# Patient Record
Sex: Female | Born: 1955 | Race: White | Hispanic: No | Marital: Married | Smoking: Current every day smoker
Health system: Southern US, Community
[De-identification: ages and names within clinical notes are randomized; demographics above are authoritative.]

## PROBLEM LIST (undated history)

## (undated) DIAGNOSIS — R1011 Right upper quadrant pain: Secondary | ICD-10-CM

## (undated) DIAGNOSIS — K120 Recurrent oral aphthae: Secondary | ICD-10-CM

## (undated) DIAGNOSIS — K649 Unspecified hemorrhoids: Secondary | ICD-10-CM

## (undated) DIAGNOSIS — B1920 Unspecified viral hepatitis C without hepatic coma: Secondary | ICD-10-CM

## (undated) DIAGNOSIS — Z972 Presence of dental prosthetic device (complete) (partial): Secondary | ICD-10-CM

## (undated) DIAGNOSIS — M8580 Other specified disorders of bone density and structure, unspecified site: Secondary | ICD-10-CM

## (undated) DIAGNOSIS — L570 Actinic keratosis: Secondary | ICD-10-CM

## (undated) DIAGNOSIS — M549 Dorsalgia, unspecified: Secondary | ICD-10-CM

## (undated) DIAGNOSIS — E039 Hypothyroidism, unspecified: Secondary | ICD-10-CM

## (undated) DIAGNOSIS — M1990 Unspecified osteoarthritis, unspecified site: Secondary | ICD-10-CM

## (undated) DIAGNOSIS — L900 Lichen sclerosus et atrophicus: Secondary | ICD-10-CM

## (undated) DIAGNOSIS — H353190 Nonexudative age-related macular degeneration, unspecified eye, stage unspecified: Secondary | ICD-10-CM

## (undated) DIAGNOSIS — R5383 Other fatigue: Secondary | ICD-10-CM

## (undated) DIAGNOSIS — E079 Disorder of thyroid, unspecified: Secondary | ICD-10-CM

## (undated) DIAGNOSIS — E785 Hyperlipidemia, unspecified: Secondary | ICD-10-CM

## (undated) DIAGNOSIS — N760 Acute vaginitis: Secondary | ICD-10-CM

## (undated) DIAGNOSIS — K5730 Diverticulosis of large intestine without perforation or abscess without bleeding: Secondary | ICD-10-CM

## (undated) DIAGNOSIS — M4800 Spinal stenosis, site unspecified: Secondary | ICD-10-CM

## (undated) HISTORY — DX: Nonexudative age-related macular degeneration, unspecified eye, stage unspecified: H35.3190

## (undated) HISTORY — DX: Hypothyroidism, unspecified: E03.9

## (undated) HISTORY — DX: Unspecified hemorrhoids: K64.9

## (undated) HISTORY — DX: Right upper quadrant pain: R10.11

## (undated) HISTORY — DX: Unspecified viral hepatitis C without hepatic coma: B19.20

## (undated) HISTORY — DX: Acute vaginitis: N76.0

## (undated) HISTORY — DX: Diverticulosis of large intestine without perforation or abscess without bleeding: K57.30

## (undated) HISTORY — DX: Disorder of thyroid, unspecified: E07.9

## (undated) HISTORY — DX: Recurrent oral aphthae: K12.0

## (undated) HISTORY — PX: ABDOMINAL HYSTERECTOMY: SHX81

## (undated) HISTORY — DX: Other specified disorders of bone density and structure, unspecified site: M85.80

## (undated) HISTORY — DX: Actinic keratosis: L57.0

## (undated) HISTORY — DX: Hyperlipidemia, unspecified: E78.5

## (undated) HISTORY — DX: Dorsalgia, unspecified: M54.9

## (undated) HISTORY — DX: Other fatigue: R53.83

## (undated) HISTORY — DX: Lichen sclerosus et atrophicus: L90.0

## (undated) HISTORY — PX: TONSILLECTOMY: SUR1361

## (undated) HISTORY — DX: Unspecified osteoarthritis, unspecified site: M19.90

---

## 2004-04-23 ENCOUNTER — Ambulatory Visit: Payer: Self-pay | Admitting: Family Medicine

## 2004-05-05 ENCOUNTER — Encounter: Payer: Self-pay | Admitting: Internal Medicine

## 2004-05-05 ENCOUNTER — Ambulatory Visit: Payer: Self-pay | Admitting: Family Medicine

## 2004-11-04 ENCOUNTER — Ambulatory Visit: Payer: Self-pay | Admitting: Family Medicine

## 2007-04-25 ENCOUNTER — Ambulatory Visit: Payer: Self-pay | Admitting: Internal Medicine

## 2007-04-25 DIAGNOSIS — G8929 Other chronic pain: Secondary | ICD-10-CM | POA: Insufficient documentation

## 2007-04-25 DIAGNOSIS — E039 Hypothyroidism, unspecified: Secondary | ICD-10-CM | POA: Insufficient documentation

## 2007-04-25 DIAGNOSIS — M810 Age-related osteoporosis without current pathological fracture: Secondary | ICD-10-CM | POA: Insufficient documentation

## 2007-04-25 DIAGNOSIS — L570 Actinic keratosis: Secondary | ICD-10-CM | POA: Insufficient documentation

## 2007-04-25 DIAGNOSIS — R079 Chest pain, unspecified: Secondary | ICD-10-CM | POA: Insufficient documentation

## 2007-04-25 DIAGNOSIS — M549 Dorsalgia, unspecified: Secondary | ICD-10-CM

## 2007-04-27 ENCOUNTER — Encounter: Payer: Self-pay | Admitting: Internal Medicine

## 2007-05-15 ENCOUNTER — Encounter: Payer: Self-pay | Admitting: Internal Medicine

## 2007-05-22 ENCOUNTER — Encounter: Payer: Self-pay | Admitting: Internal Medicine

## 2007-05-26 ENCOUNTER — Encounter (INDEPENDENT_AMBULATORY_CARE_PROVIDER_SITE_OTHER): Payer: Self-pay | Admitting: *Deleted

## 2007-06-08 ENCOUNTER — Ambulatory Visit: Payer: Self-pay | Admitting: Internal Medicine

## 2007-06-08 DIAGNOSIS — M1990 Unspecified osteoarthritis, unspecified site: Secondary | ICD-10-CM | POA: Insufficient documentation

## 2007-06-08 DIAGNOSIS — N760 Acute vaginitis: Secondary | ICD-10-CM | POA: Insufficient documentation

## 2007-07-17 ENCOUNTER — Telehealth (INDEPENDENT_AMBULATORY_CARE_PROVIDER_SITE_OTHER): Payer: Self-pay | Admitting: *Deleted

## 2007-09-27 ENCOUNTER — Telehealth: Payer: Self-pay | Admitting: Internal Medicine

## 2007-12-07 ENCOUNTER — Ambulatory Visit: Payer: Self-pay | Admitting: Internal Medicine

## 2007-12-08 ENCOUNTER — Telehealth (INDEPENDENT_AMBULATORY_CARE_PROVIDER_SITE_OTHER): Payer: Self-pay | Admitting: *Deleted

## 2008-02-02 ENCOUNTER — Telehealth: Payer: Self-pay | Admitting: Internal Medicine

## 2008-02-12 ENCOUNTER — Encounter: Payer: Self-pay | Admitting: Internal Medicine

## 2008-02-13 ENCOUNTER — Ambulatory Visit: Payer: Self-pay | Admitting: Internal Medicine

## 2008-02-23 ENCOUNTER — Telehealth: Payer: Self-pay | Admitting: Internal Medicine

## 2008-02-27 ENCOUNTER — Encounter: Payer: Self-pay | Admitting: Internal Medicine

## 2008-02-27 ENCOUNTER — Ambulatory Visit: Payer: Self-pay | Admitting: Internal Medicine

## 2008-02-28 ENCOUNTER — Encounter: Payer: Self-pay | Admitting: Internal Medicine

## 2008-05-24 ENCOUNTER — Telehealth: Payer: Self-pay | Admitting: Internal Medicine

## 2008-05-30 ENCOUNTER — Ambulatory Visit: Payer: Self-pay | Admitting: Internal Medicine

## 2008-05-30 DIAGNOSIS — R5381 Other malaise: Secondary | ICD-10-CM | POA: Insufficient documentation

## 2008-05-30 DIAGNOSIS — R5383 Other fatigue: Secondary | ICD-10-CM

## 2008-07-03 ENCOUNTER — Encounter: Payer: Self-pay | Admitting: Internal Medicine

## 2008-07-22 ENCOUNTER — Telehealth: Payer: Self-pay | Admitting: Internal Medicine

## 2008-09-23 ENCOUNTER — Telehealth: Payer: Self-pay | Admitting: Internal Medicine

## 2008-11-22 ENCOUNTER — Telehealth: Payer: Self-pay | Admitting: Internal Medicine

## 2009-01-22 ENCOUNTER — Telehealth: Payer: Self-pay | Admitting: Internal Medicine

## 2009-02-07 ENCOUNTER — Ambulatory Visit: Payer: Self-pay | Admitting: Internal Medicine

## 2009-02-07 DIAGNOSIS — R1011 Right upper quadrant pain: Secondary | ICD-10-CM | POA: Insufficient documentation

## 2009-02-07 LAB — CONVERTED CEMR LAB
Ketones, urine, test strip: NEGATIVE
Nitrite: NEGATIVE
Protein, U semiquant: NEGATIVE
RBC / HPF: 0
Specific Gravity, Urine: 1.01
WBC Urine, dipstick: NEGATIVE
pH: 6.5

## 2009-02-10 LAB — CONVERTED CEMR LAB
AST: 21 units/L (ref 0–37)
Albumin: 3.8 g/dL (ref 3.5–5.2)
Alkaline Phosphatase: 54 units/L (ref 39–117)
Amylase: 101 units/L (ref 27–131)
BUN: 16 mg/dL (ref 6–23)
Basophils Absolute: 0.1 10*3/uL (ref 0.0–0.1)
Bilirubin, Direct: 0.1 mg/dL (ref 0.0–0.3)
Chloride: 102 meq/L (ref 96–112)
Eosinophils Absolute: 0.2 10*3/uL (ref 0.0–0.7)
GFR calc non Af Amer: 79 mL/min (ref >60)
Glucose, Bld: 79 mg/dL (ref 70–99)
Lymphocytes Relative: 32.7 % (ref 12.0–46.0)
MCHC: 34.4 g/dL (ref 30.0–36.0)
Monocytes Relative: 8.1 % (ref 3.0–12.0)
Neutrophils Relative %: 55.7 % (ref 43.0–77.0)
Phosphorus: 4 mg/dL (ref 2.3–4.6)
Platelets: 214 10*3/uL (ref 150.0–400.0)
RDW: 13.2 % (ref 11.5–14.6)
Total Bilirubin: 0.5 mg/dL (ref 0.3–1.2)

## 2009-02-13 ENCOUNTER — Ambulatory Visit: Payer: Self-pay | Admitting: Internal Medicine

## 2009-02-13 ENCOUNTER — Encounter: Payer: Self-pay | Admitting: Internal Medicine

## 2009-02-14 ENCOUNTER — Telehealth: Payer: Self-pay | Admitting: Internal Medicine

## 2009-02-14 ENCOUNTER — Encounter (INDEPENDENT_AMBULATORY_CARE_PROVIDER_SITE_OTHER): Payer: Self-pay | Admitting: *Deleted

## 2009-02-18 ENCOUNTER — Ambulatory Visit: Payer: Self-pay | Admitting: Internal Medicine

## 2009-02-18 DIAGNOSIS — K5730 Diverticulosis of large intestine without perforation or abscess without bleeding: Secondary | ICD-10-CM | POA: Insufficient documentation

## 2009-03-19 ENCOUNTER — Telehealth: Payer: Self-pay | Admitting: Internal Medicine

## 2009-05-20 ENCOUNTER — Telehealth: Payer: Self-pay | Admitting: Internal Medicine

## 2009-07-17 ENCOUNTER — Telehealth: Payer: Self-pay | Admitting: Internal Medicine

## 2009-09-16 ENCOUNTER — Telehealth: Payer: Self-pay | Admitting: Internal Medicine

## 2009-11-10 ENCOUNTER — Ambulatory Visit: Payer: Self-pay | Admitting: Internal Medicine

## 2009-11-17 ENCOUNTER — Telehealth: Payer: Self-pay | Admitting: Internal Medicine

## 2009-11-24 ENCOUNTER — Ambulatory Visit: Payer: Self-pay | Admitting: Family Medicine

## 2010-01-15 ENCOUNTER — Telehealth: Payer: Self-pay | Admitting: Internal Medicine

## 2010-03-16 ENCOUNTER — Telehealth: Payer: Self-pay | Admitting: Internal Medicine

## 2010-05-07 ENCOUNTER — Ambulatory Visit: Payer: Self-pay | Admitting: Internal Medicine

## 2010-05-07 ENCOUNTER — Encounter: Payer: Self-pay | Admitting: Internal Medicine

## 2010-05-10 LAB — CONVERTED CEMR LAB
ALT: 12 units/L (ref 0–35)
AST: 24 units/L (ref 0–37)
Albumin: 3.9 g/dL (ref 3.5–5.2)
Alkaline Phosphatase: 55 units/L (ref 39–117)
BUN: 16 mg/dL (ref 6–23)
Bilirubin, Direct: 0.1 mg/dL (ref 0.0–0.3)
Calcium: 9.4 mg/dL (ref 8.4–10.5)
Chloride: 103 meq/L (ref 96–112)
Creatinine, Ser: 0.8 mg/dL (ref 0.4–1.2)
Creatinine, Ser: 1 mg/dL (ref 0.4–1.2)
Direct LDL: 173.1 mg/dL
Eosinophils Relative: 1.7 % (ref 0.0–5.0)
Free T4: 0.7 ng/dL (ref 0.6–1.6)
GFR calc Af Amer: 96 mL/min
GFR calc non Af Amer: 62 mL/min
Glucose, Bld: 85 mg/dL (ref 70–99)
HCT: 39.6 % (ref 36.0–46.0)
HDL: 42.1 mg/dL (ref >39.0)
Lymphocytes Relative: 22 % (ref 12.0–46.0)
Monocytes Relative: 5.4 % (ref 3.0–12.0)
Neutrophils Relative %: 70.4 % (ref 43.0–77.0)
Phosphorus: 4.3 mg/dL (ref 2.3–4.6)
Platelets: 225 10*3/uL (ref 150–400)
RDW: 13.2 % (ref 11.5–14.6)
Sodium: 140 meq/L (ref 135–145)
TSH: 1.34 microintl units/mL (ref 0.35–5.50)
TSH: 1.35 microintl units/mL (ref 0.35–5.50)
Total Bilirubin: 0.7 mg/dL (ref 0.3–1.2)
Total Protein: 6.7 g/dL (ref 6.0–8.3)
WBC: 13.1 10*3/uL — ABNORMAL HIGH (ref 4.5–10.5)

## 2010-05-13 NOTE — Progress Notes (Signed)
Summary: norco   Phone Note Refill Request Message from:  Fax from Pharmacy on January 15, 2010 2:50 PM  Refills Requested: Medication #1:  NORCO 5-325 MG  TABS 1-2 at bedtime as needed severe back pain   Last Refilled: 12/17/2009 [facility] [address] Form is on your desk. [phone]  Initial call taken by: Melody Comas,  January 15, 2010 2:51 PM  Follow-up for Phone Call        okay #60 x 1 Follow-up by: Cindee Salt MD,  January 15, 2010 5:24 PM  Additional Follow-up for Phone Call Additional follow up Details #1::        Rx faxed to pharmacy Additional Follow-up by: DeShannon Smith CMA Duncan Dull),  January 16, 2010 8:48 AM    Prescriptions: NORCO 5-325 MG  TABS (HYDROCODONE-ACETAMINOPHEN) 1-2 at bedtime as needed severe back pain  #60 x 1   Entered by:   Mervin Hack CMA (AAMA)   Authorized by:   Cindee Salt MD   Signed by:   Mervin Hack CMA (AAMA) on 01/16/2010   Method used:   Handwritten   RxID:   [id number]

## 2010-05-13 NOTE — Progress Notes (Signed)
Summary: refill request for norco  Phone Note Refill Request Message from:  Fax from Pharmacy  Refills Requested: Medication #1:  NORCO 5-325 MG  TABS 1-2 at bedtime as needed severe back pain   Last Refilled: 06/17/2009 [facility]  Initial call taken by: Lowella Petties CMA,  July 17, 2009 12:31 PM  Follow-up for Phone Call        okay #60 x 1 Follow-up by: Cindee Salt MD,  July 17, 2009 1:08 PM  Additional Follow-up for Phone Call Additional follow up Details #1::        Rx faxed to pharmacy Additional Follow-up by: DeShannon Smith CMA Duncan Dull),  July 17, 2009 2:54 PM    Prescriptions: NORCO 5-325 MG  TABS (HYDROCODONE-ACETAMINOPHEN) 1-2 at bedtime as needed severe back pain  #60 x 1   Entered by:   Mervin Hack CMA (AAMA)   Authorized by:   Cindee Salt MD   Signed by:   Mervin Hack CMA (AAMA) on 07/17/2009   Method used:   Handwritten   RxID:   [id number]

## 2010-05-13 NOTE — Progress Notes (Signed)
Summary: refill request for norco  Phone Note Refill Request Message from:  Fax from Pharmacy  Refills Requested: Medication #1:  NORCO 5-325 MG  TABS 1-2 at bedtime as needed severe back pain   Last Refilled: 04/18/2009 [facility].  Initial call taken by: Lowella Petties CMA,  May 20, 2009 3:18 PM  Follow-up for Phone Call        okay #60 x 1 Follow-up by: Cindee Salt MD,  May 21, 2009 10:03 AM  Additional Follow-up for Phone Call Additional follow up Details #1::        Rx faxed to pharmacy Additional Follow-up by: DeShannon Smith CMA Duncan Dull),  May 21, 2009 10:05 AM    Prescriptions: NORCO 5-325 MG  TABS (HYDROCODONE-ACETAMINOPHEN) 1-2 at bedtime as needed severe back pain  #60 x 1   Entered by:   Mervin Hack CMA (AAMA)   Authorized by:   Cindee Salt MD   Signed by:   Mervin Hack CMA (AAMA) on 05/21/2009   Method used:   Handwritten   RxID:   [id number]

## 2010-05-13 NOTE — Progress Notes (Signed)
Summary: refill request for norco  Phone Note Refill Request Message from:  Fax from Pharmacy  Refills Requested: Medication #1:  NORCO 5-325 MG  TABS 1-2 at bedtime as needed severe back pain   Last Refilled: 10/15/2009 [facility].  Initial call taken by: Lowella Petties CMA,  November 17, 2009 11:15 AM  Follow-up for Phone Call        okay #60 x 1 Follow-up by: Cindee Salt MD,  November 17, 2009 5:37 PM  Additional Follow-up for Phone Call Additional follow up Details #1::        Rx called to pharmacy Additional Follow-up by: Sydell Axon LPN,  November 17, 2009 5:42 PM    Prescriptions: NORCO 5-325 MG  TABS (HYDROCODONE-ACETAMINOPHEN) 1-2 at bedtime as needed severe back pain  #60 x 1   Entered by:   Sydell Axon LPN   Authorized by:   Cindee Salt MD   Signed by:   Sydell Axon LPN on 59/56/3875   Method used:   Telephoned to ...       [facility] [phone]* (retail)       [address]       [place], [place]  [address]       Ph: [phone]       Fax: [phone]   RxID:   [id number]

## 2010-05-13 NOTE — Progress Notes (Signed)
Summary: refill request for norco  Phone Note Refill Request Message from:  Fax from Pharmacy  Refills Requested: Medication #1:  NORCO 5-325 MG  TABS 1-2 at bedtime as needed severe back pain   Last Refilled: 08/17/2009 [facility].  Initial call taken by: Lowella Petties CMA,  September 16, 2009 12:47 PM  Follow-up for Phone Call        okay #60 x 1 Follow-up by: Cindee Salt MD,  September 16, 2009 1:44 PM  Additional Follow-up for Phone Call Additional follow up Details #1::        Rx called to pharmacy Additional Follow-up by: DeShannon Smith CMA Duncan Dull),  September 16, 2009 2:35 PM    Prescriptions: NORCO 5-325 MG  TABS (HYDROCODONE-ACETAMINOPHEN) 1-2 at bedtime as needed severe back pain  #60 x 1   Entered by:   Mervin Hack CMA (AAMA)   Authorized by:   Cindee Salt MD   Signed by:   Mervin Hack CMA (AAMA) on 09/16/2009   Method used:   Telephoned to ...       [facility] [phone]* (retail)       [address]       [place], [place]  [address]       Ph: [phone]       Fax: [phone]   RxID:   [id number]

## 2010-05-13 NOTE — Progress Notes (Signed)
Summary: norco   Phone Note Refill Request Message from:  Fax from Pharmacy on March 16, 2010 11:28 AM  Refills Requested: Medication #1:  NORCO 5-325 MG  TABS 1-2 at bedtime as needed severe back pain   Last Refilled: 02/15/2010 [facility] [address] [phone]. Fax is on your desk.   Initial call taken by: Melody Comas,  March 16, 2010 11:30 AM  Follow-up for Phone Call        okay #60 x 1 Follow-up by: Cindee Salt MD,  March 16, 2010 1:49 PM  Additional Follow-up for Phone Call Additional follow up Details #1::        Rx faxed to pharmacy Additional Follow-up by: DeShannon Smith CMA Duncan Dull),  March 16, 2010 2:23 PM    Prescriptions: NORCO 5-325 MG  TABS (HYDROCODONE-ACETAMINOPHEN) 1-2 at bedtime as needed severe back pain  #60 x 1   Entered by:   Mervin Hack CMA (AAMA)   Authorized by:   Cindee Salt MD   Signed by:   Mervin Hack CMA (AAMA) on 03/16/2010   Method used:   Handwritten   RxID:   [id number]

## 2010-05-18 ENCOUNTER — Telehealth: Payer: Self-pay | Admitting: Internal Medicine

## 2010-05-19 ENCOUNTER — Ambulatory Visit: Payer: Self-pay | Admitting: Internal Medicine

## 2010-05-20 ENCOUNTER — Ambulatory Visit (INDEPENDENT_AMBULATORY_CARE_PROVIDER_SITE_OTHER): Payer: 59 | Admitting: Internal Medicine

## 2010-05-20 ENCOUNTER — Other Ambulatory Visit: Payer: Self-pay | Admitting: Internal Medicine

## 2010-05-20 ENCOUNTER — Encounter: Payer: Self-pay | Admitting: Internal Medicine

## 2010-05-20 DIAGNOSIS — E039 Hypothyroidism, unspecified: Secondary | ICD-10-CM

## 2010-05-20 DIAGNOSIS — R1011 Right upper quadrant pain: Secondary | ICD-10-CM

## 2010-05-20 DIAGNOSIS — M1990 Unspecified osteoarthritis, unspecified site: Secondary | ICD-10-CM

## 2010-05-28 NOTE — Progress Notes (Signed)
Summary: norco  Phone Note Refill Request Message from:  Fax from Pharmacy on May 18, 2010 10:09 AM  Refills Requested: Medication #1:  NORCO 5-325 MG  TABS 1-2 at bedtime as needed severe back pain   Last Refilled: 04/17/2010 [facility] [address] [phone]. fax is on your desk  Initial call taken by: Melody Comas,  May 18, 2010 10:10 AM  Follow-up for Phone Call        okay #60 x 0  Very overdue for appt she needs physical but will need some appt before more meds can be done Follow-up by: Cindee Salt MD,  May 18, 2010 1:52 PM  Additional Follow-up for Phone Call Additional follow up Details #1::        Rx faxed to pharmacy, Spoke with patient and advised results, pt states she will call later for an appt. Additional Follow-up by: Mervin Hack CMA Duncan Dull),  May 18, 2010 4:59 PM

## 2010-05-28 NOTE — Assessment & Plan Note (Signed)
Summary: URGENT CARE F/U FOR PAIN UNDER RGT RIB CAGE / LFW   Vital Signs:  Patient profile:   55 year old female Weight:      143 pounds BMI:     25.02 Temp:     97.9 degrees F oral Pulse rate:   74 / minute Pulse rhythm:   regular BP sitting:   112 / 62  (left arm) Cuff size:   regular  Vitals Entered By: Mervin Hack CMA Duncan Dull) (May 20, 2010 11:08 AM) CC: urgent care follow-up   History of Present Illness: Having the same abdominal pain in RUQ seen in urgent care-then Dr Judithann Graves had abdominal ultrasound---report reviewed and it was normal H pylori reportedly normal as well  Has had steady pain for the past 5 months Now seems more in right side of epigastrium No EGD in past No ASA or NSAIDs No PPI now although did have trial in past Steady--no relieving or exacerbating factors May awaken her and needs to readjust position No pleuritic component No histroy of shingles No worsening with exertion during pool season  still gets relief from norco for her back also helps at night for abd pain Does have burning pain around the same dermatome of back  Had synthroid in past Hasn't used in 5-6 years  Allergies: 1)  ! Indocin (Indomethacin Sodium)  Past History:  Past medical, surgical, family and social histories (including risk factors) reviewed for relevance to current acute and chronic problems.  Past Medical History: Reviewed history from 02/18/2009 and no changes required. Osteoporosis Hypothyroidism Osteoarthritis--back Lichen sclerosis--------------------------------Dr Wasington  Past Surgical History: Reviewed history from 04/25/2007 and no changes required. Hysterectomy--age 44 after tubal and infection  Family History: Reviewed history from 04/25/2007 and no changes required. Dad has vascular disease, HTN Mom died of lung cancer @54  3 sisters, 1 brother 3 stepsisters, 1 stepbrother No CAD, DM No breast or colon cancer  Social  History: Reviewed history from 02/18/2009 and no changes required. Occupation: runs pool service for 7 locations Married--no children Alcohol use-no Patient currently smokes. -15 cig. daily Daily Caffeine Use-2 drinks daily Patient does not get regular exercise.   Review of Systems       Bowels are normal appetite is good---weight is up 8# since last visit  Physical Exam  General:  alert and normal appearance.   Neck:  supple, no masses, no thyromegaly, and no cervical lymphadenopathy.   Lungs:  normal respiratory effort, no intercostal retractions, no accessory muscle use, and normal breath sounds.   Heart:  normal rate, regular rhythm, no murmur, and no gallop.   Abdomen:  soft, non-tender, no masses, no hepatomegaly, and no splenomegaly.   Extremities:  no edema Psych:  normally interactive, good eye contact, not anxious appearing, and not depressed appearing.     Impression & Recommendations:  Problem # 1:  ABDOMINAL PAIN, RIGHT UPPER QUADRANT (ICD-789.01) Assessment Deteriorated  now more to right of epigastrium doesn't seem GI with similar level burning in back--suggests neuropathy will check B12 start gabapentin  Orders: TLB-B12, Serum-Total ONLY (81191-Y78)  Problem # 2:  OSTEOARTHRITIS (ICD-715.90) Assessment: Unchanged stable with norco  The following medications were removed from the medication list:    Aspirin 325 Mg Tabs (Aspirin) .Marland Kitchen... Take 3-5 capsules by mouth as needed    Clinoril 200 Mg Tabs (Sulindac) .Marland Kitchen... Take 1 tab x 7 days then decrease to 3 times a week. Her updated medication list for this problem includes:    Norco 5-325  Mg Tabs (Hydrocodone-acetaminophen) .Marland Kitchen... 1-2 at bedtime as needed severe back pain  Problem # 3:  HYPOTHYROIDISM (ICD-244.9) Assessment: Comment Only  check labs has been clinically euthyroid  Orders: Venipuncture (81191) TLB-TSH (Thyroid Stimulating Hormone) (84443-TSH) TLB-T4 (Thyrox), Free  [phone])  Complete Medication List: 1)  Norco 5-325 Mg Tabs (Hydrocodone-acetaminophen) .Marland Kitchen.. 1-2 at bedtime as needed severe back pain 2)  Gabapentin 300 Mg Caps (Gabapentin) .Marland Kitchen.. 1-2 capsules at bedtime to help nerve pain  Patient Instructions: 1)  Please schedule a follow-up appointment in 4-6  months  for physical Prescriptions: GABAPENTIN 300 MG CAPS (GABAPENTIN) 1-2 capsules at bedtime to help nerve pain  #60 x 5   Entered and Authorized by:   Cindee Salt MD   Signed by:   Cindee Salt MD on 05/20/2010   Method used:   [facility]        [facility]  [address] [phone]* (retail)       [address]       [place], [place]  [address]       Ph: [phone]       Fax: [phone]   RxID:   [id number]    Orders Added: 1)  Venipuncture [87564] 2)  TLB-TSH (Thyroid Stimulating Hormone) [84443-TSH] 3)  TLB-T4 (Thyrox), Free [33295-JO8C] 4)  TLB-B12, Serum-Total ONLY [82607-B12] 5)  Est. Patient Level IV [16606]    Current Allergies (reviewed today): ! INDOCIN (INDOMETHACIN SODIUM)

## 2010-06-09 NOTE — Letter (Signed)
Summary: [facility] Discharge Summary  [facility] Discharge Summary   Imported By: Kassie Mends 06/02/2010 11:35:10  _____________________________________________________________________  External Attachment:    Type:   Image     Comment:   External Document

## 2010-06-16 ENCOUNTER — Telehealth: Payer: Self-pay | Admitting: Family Medicine

## 2010-06-23 NOTE — Progress Notes (Signed)
Summary: refill request for norco  Phone Note Refill Request Message from:  Fax from Pharmacy  Refills Requested: Medication #1:  NORCO 5-325 MG  TABS 1-2 at bedtime as needed severe back pain   Last Refilled: 05/18/2010 [facility] [phone].  Initial call taken by: Lowella Petties CMA, AAMA,  June 16, 2010 9:54 AM  Follow-up for Phone Call        Rx called to pharmacy Follow-up by: Benny Lennert CMA Duncan Dull),  June 16, 2010 11:03 AM    Prescriptions: NORCO 5-325 MG  TABS (HYDROCODONE-ACETAMINOPHEN) 1-2 at bedtime as needed severe back pain  #60 x 1   Entered and Authorized by:   Kerby Nora MD   Signed by:   Kerby Nora MD on 06/16/2010   Method used:   Telephoned to ...       [facility] [phone]* (retail)       [address]       [place], [place]  [address]       Ph: [phone]       Fax: [phone]   RxID:   [id number]

## 2010-08-17 ENCOUNTER — Other Ambulatory Visit: Payer: Self-pay | Admitting: *Deleted

## 2010-08-17 MED ORDER — HYDROCODONE-ACETAMINOPHEN 10-650 MG PO TABS: ORAL_TABLET | ORAL | Status: DC

## 2010-08-17 NOTE — Telephone Encounter (Signed)
rx called into pharmacy

## 2010-08-17 NOTE — Telephone Encounter (Signed)
Dr. Alphonsus Sias in office today will forward to him.

## 2010-08-17 NOTE — Telephone Encounter (Signed)
Okay #30 x 1 

## 2010-08-17 NOTE — Telephone Encounter (Signed)
duplicate

## 2010-09-30 ENCOUNTER — Encounter: Payer: Self-pay | Admitting: Internal Medicine

## 2010-10-01 ENCOUNTER — Ambulatory Visit (INDEPENDENT_AMBULATORY_CARE_PROVIDER_SITE_OTHER): Payer: 59 | Admitting: Internal Medicine

## 2010-10-01 ENCOUNTER — Encounter: Payer: Self-pay | Admitting: Internal Medicine

## 2010-10-01 DIAGNOSIS — Z1231 Encounter for screening mammogram for malignant neoplasm of breast: Secondary | ICD-10-CM

## 2010-10-01 DIAGNOSIS — Z0000 Encounter for general adult medical examination without abnormal findings: Secondary | ICD-10-CM | POA: Insufficient documentation

## 2010-10-01 DIAGNOSIS — M549 Dorsalgia, unspecified: Secondary | ICD-10-CM

## 2010-10-01 DIAGNOSIS — E039 Hypothyroidism, unspecified: Secondary | ICD-10-CM

## 2010-10-01 NOTE — Assessment & Plan Note (Signed)
Healthy Discussed stopping smoking again--she is committed to this Will set up mammo  Check glucose

## 2010-10-01 NOTE — Assessment & Plan Note (Signed)
Arthritic and neuropathic? Okay with gabapentin and hydrocodone at night

## 2010-10-01 NOTE — Patient Instructions (Signed)
Please set up mammogram

## 2010-10-01 NOTE — Progress Notes (Signed)
Subjective:    Patient ID: Catherine Griffin, female    DOB: 07-07-1955, 55 y.o.   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 gabapentin (NEURONTIN) 300 MG capsule Take 300-600 mg by mouth at bedtime as needed.        Marland Kitchen HYDROcodone-acetaminophen (LORCET) 10-650 MG per tablet Take 1/2 to 1 tablet at bedtime  30 tablet  1   Past Medical History  Diagnosis Date  . Osteoporosis   . Thyroid disease   . Arthritis   . Lichen sclerosus     Past Surgical History  Procedure Date  . Abdominal hysterectomy     Family History  Problem Relation Age of Onset  . Hypertension Father   . Coronary artery disease Neg Hx   . Diabetes Neg Hx   . Cancer Neg Hx     History   Social History  . Marital Status: Married    Spouse Name: N/A    Number of Children: N/A  . Years of Education: N/A   Occupational History  . runs pool service for 7 locations    Social History Main Topics  . Smoking status: Current Everyday Smoker    Types: Cigarettes  . Smokeless tobacco: Never Used  . Alcohol Use: No  . Drug Use: No  . Sexually Active: Not on file   Other Topics Concern  . Not on file   Social History Narrative   Daily caffeine use- 2 drinks dailyPatient does not get regular exercise   Review of Systems  Constitutional: Negative for fatigue and unexpected weight change.       Wears seat belt  HENT: Positive for congestion and rhinorrhea. Negative for hearing loss, dental problem and tinnitus.        Not sure if cold or allergies recently Regular with dentist  Eyes:       No focal vision loss or  diplopia  Respiratory: Positive for cough. Negative for chest tightness and shortness of breath.   Cardiovascular: Positive for palpitations. Negative for chest pain and leg swelling.       Occ slight palpitations---like too fast. Like before cardiac work up in past (negative)  Gastrointestinal: Positive for abdominal pain. Negative for nausea, vomiting, constipation and blood in stool.       No heartburn  Genitourinary: Negative for dysuria, difficulty urinating and dyspareunia.  Musculoskeletal: Positive for back pain and arthralgias. Negative for joint swelling.       Scattered pains  Skin: Negative for rash.       No suspicious lesions  Neurological: Negative for dizziness, syncope, weakness, light-headedness, numbness and headaches.  Hematological: Negative for adenopathy. Bruises/bleeds easily.  Psychiatric/Behavioral: Negative for sleep disturbance and dysphoric mood. The patient is not nervous/anxious.        Objective:   Physical Exam  Constitutional: She is oriented to person, place, and time. She appears well-developed and well-nourished. No distress.  HENT:  Head: Normocephalic and atraumatic.  Right Ear: External ear normal.  Left Ear: External ear normal.  Mouth/Throat: Oropharynx is clear and moist. No oropharyngeal exudate.  TMs fine  Eyes: Conjunctivae and EOM are normal. Pupils are equal, round, and reactive to light.       Fundi benign  Neck: Normal range of motion. Neck supple. No thyromegaly present.  Cardiovascular: Normal rate, regular rhythm and intact distal pulses.  Exam reveals no gallop.   No murmur heard. Pulmonary/Chest: Effort normal and breath sounds normal. No respiratory distress. She has no wheezes. She has no rales.  Abdominal: Soft. She exhibits no mass. There is no tenderness.  Genitourinary:       Breast show moderate cystic changes bilaterally without worrisome mass  Musculoskeletal: Normal range of motion. She exhibits no edema and no  tenderness.  Lymphadenopathy:    She has no cervical adenopathy.    She has no axillary adenopathy.  Neurological: She is alert and oriented to person, place, and time. She exhibits normal muscle tone.       Normal strength and gait  Skin: No rash noted.       No suspicious lesions  Psychiatric: She has a normal mood and affect. Her behavior is normal. Judgment and thought content normal.          Assessment & Plan:

## 2010-10-01 NOTE — Assessment & Plan Note (Signed)
Hasn't needed meds Will check labs

## 2010-10-02 NOTE — Progress Notes (Signed)
Addended by: Alvina Chou on: 10/02/2010 03:01 PM   Modules accepted: Orders

## 2010-10-09 ENCOUNTER — Other Ambulatory Visit: Payer: 59

## 2010-10-09 ENCOUNTER — Other Ambulatory Visit (INDEPENDENT_AMBULATORY_CARE_PROVIDER_SITE_OTHER): Payer: 59 | Admitting: Internal Medicine

## 2010-10-09 DIAGNOSIS — Z0000 Encounter for general adult medical examination without abnormal findings: Secondary | ICD-10-CM

## 2010-10-09 DIAGNOSIS — E039 Hypothyroidism, unspecified: Secondary | ICD-10-CM

## 2010-10-09 LAB — BASIC METABOLIC PANEL
BUN: 19 mg/dL (ref 6–23)
Calcium: 9 mg/dL (ref 8.4–10.5)
Creatinine, Ser: 0.7 mg/dL (ref 0.4–1.2)
GFR: 94.73 mL/min (ref >60.00)

## 2010-10-09 LAB — CBC WITH DIFFERENTIAL/PLATELET
Basophils Relative: 0.3 % (ref 0.0–3.0)
Eosinophils Absolute: 0.2 10*3/uL (ref 0.0–0.7)
Hemoglobin: 13.6 g/dL (ref 12.0–15.0)
MCHC: 34.3 g/dL (ref 30.0–36.0)
MCV: 94.7 fl (ref 78.0–100.0)
Monocytes Absolute: 0.7 10*3/uL (ref 0.1–1.0)
Neutro Abs: 6.3 10*3/uL (ref 1.4–7.7)
Neutrophils Relative %: 63.8 % (ref 43.0–77.0)
RBC: 4.19 Mil/uL (ref 3.87–5.11)

## 2010-10-09 LAB — T4, FREE: Free T4: 0.71 ng/dL (ref 0.60–1.60)

## 2010-10-09 LAB — LIPID PANEL: VLDL: 30.2 mg/dL (ref 0.0–40.0)

## 2010-10-09 LAB — HEPATIC FUNCTION PANEL
Alkaline Phosphatase: 60 U/L (ref 39–117)
Bilirubin, Direct: 0.1 mg/dL (ref 0.0–0.3)
Total Protein: 6.7 g/dL (ref 6.0–8.3)

## 2010-10-19 ENCOUNTER — Other Ambulatory Visit: Payer: Self-pay | Admitting: *Deleted

## 2010-10-19 MED ORDER — HYDROCODONE-ACETAMINOPHEN 10-650 MG PO TABS: ORAL_TABLET | ORAL | Status: DC

## 2010-10-19 NOTE — Telephone Encounter (Signed)
rx faxed to pharmacy manually  

## 2010-10-19 NOTE — Telephone Encounter (Signed)
Okay #30 x 1 

## 2010-10-19 NOTE — Telephone Encounter (Signed)
Received faxed refill request from pharmacy.

## 2010-12-01 ENCOUNTER — Ambulatory Visit (INDEPENDENT_AMBULATORY_CARE_PROVIDER_SITE_OTHER): Payer: 59 | Admitting: Family Medicine

## 2010-12-01 ENCOUNTER — Encounter: Payer: Self-pay | Admitting: Family Medicine

## 2010-12-01 VITALS — BP 132/78 | HR 76 | Temp 98.4°F | Wt 133.2 lb

## 2010-12-01 DIAGNOSIS — K649 Unspecified hemorrhoids: Secondary | ICD-10-CM

## 2010-12-01 DIAGNOSIS — K644 Residual hemorrhoidal skin tags: Secondary | ICD-10-CM

## 2010-12-01 MED ORDER — HYDROCORTISONE 2.5 % RE CREA: TOPICAL_CREAM | Freq: Two times a day (BID) | RECTAL | Status: AC

## 2010-12-01 NOTE — Patient Instructions (Signed)
Looks like external hemorrhoid.  We will refer you to surgeon for evaluation. In meantime, anusol hc as needed, try ice to area. Goal is 1 soft stool a day.  Recommend docusate or miralax instead of dulcolax. Update [place] if worsening pain. Hemorrhoids Hemorrhoids are dilated (enlarged) veins around the rectum. Sometimes clots will form in the veins. This makes them swollen and painful. These are called thrombosed hemorrhoids. Causes of hemorrhoids include:  Pregnancy: this increases the pressure in the hemorrhoidal veins.   Constipation.   Straining to have a bowel movement.  HOME CARE INSTRUCTIONS  Eat a well balanced diet and drink 6 to 8 glasses of water every day to avoid constipation. You may also use a bulk laxative.   Avoid straining to have bowel movements.   Keep anal area dry and clean.   Only take over-the-counter or prescription medicines for pain, discomfort, or fever as directed by your caregiver.  If thrombosed:  Take hot sitz baths for 20 to 30 minutes, 3 to 4 times per day.   If the hemorrhoids are very tender and swollen, place ice packs on area as tolerated. Using ice packs between sitz baths may be helpful. Fill a plastic bag with ice and use a towel between the bag of ice and your skin.   Special creams and suppositories (Anusol, Nupercainal, Wyanoids) may be used or applied as directed.   Do not use a donut shaped pillow or sit on the toilet for long periods. This increases blood pooling and pain.   Move your bowels when your body has the urge; this will require less straining and will decrease pain and pressure.   Only take over-the-counter or prescription medicines for pain, discomfort, or fever as directed by your caregiver.  SEEK MEDICAL CARE IF:  You have increasing pain and swelling that is not controlled with your prescription.   You have uncontrolled bleeding.   You have an inability or difficulty having a bowel movement.   You have pain or  inflammation outside the area of the hemorrhoids.   You have chills and/or an oral temperature above 101 that lasts for 2 days or longer, or as your caregiver suggests.  MAKE SURE YOU:   Understand these instructions.   Will watch your condition.   Will get help right away if you are not doing well or get worse.  Document Released: 03/26/2000 Document Re-Released: 03/11/2008 [facility] Patient Information 2011 [place], [place].

## 2010-12-01 NOTE — Progress Notes (Signed)
  Subjective:    Patient ID: Catherine Griffin, female    DOB: 29-Jun-1955, 55 y.o.   MRN: [id number]  HPI CC: external hemorrhoid  Thinks has external hemorrhoid, started bleeding 3-4 days ago, started scaring her.  Worried about infection.  Not currently bleeding, not currently tender.  Has had for 2 wks.  Tried cooling gel and prep H.  Also using neosporin.  Using sitz baths in hot water as well.  Mostly spotting on minipads.  H/o hysterectomy, had hemorrhoids afterwards. Colonoscopy ~2010, told looking ok.  Review of Systems Per HPI    Objective:   Physical Exam  Nursing note and vitals reviewed. Constitutional: She appears well-developed and well-nourished. No distress.  Genitourinary: Rectal exam shows external hemorrhoid (posterior) and tenderness.       Posterior ext hemorrhoid, possibly thrombosed however irregular at tip of hemorrhoid - almost exophytic growth, not bleeding.          Assessment & Plan:

## 2010-12-01 NOTE — Assessment & Plan Note (Signed)
Concern for thrombosed, but somewhat irregular at tip of hemorrhoid. Referral to surgery for further eval. In meantime, anusol hc and ice.

## 2010-12-03 ENCOUNTER — Encounter (INDEPENDENT_AMBULATORY_CARE_PROVIDER_SITE_OTHER): Payer: Self-pay | Admitting: General Surgery

## 2010-12-03 ENCOUNTER — Ambulatory Visit (INDEPENDENT_AMBULATORY_CARE_PROVIDER_SITE_OTHER): Payer: 59 | Admitting: General Surgery

## 2010-12-03 VITALS — BP 114/70 | HR 62 | Temp 96.8°F | Ht 63.0 in | Wt 131.6 lb

## 2010-12-03 DIAGNOSIS — K645 Perianal venous thrombosis: Secondary | ICD-10-CM

## 2010-12-03 NOTE — Patient Instructions (Signed)
Ligature activity of the next 3-4 day Xylocaine ointment or the present hemorrhoids ointment on a p.r.n. basis. Vicodin if needed but do not operate machinery or drive if taken to the Vicodin. Long tub soaks b.i.d. starting this evening. Return to see me in approximately 3 weight

## 2010-12-03 NOTE — Progress Notes (Signed)
Subjective:     Patient ID: Catherine Griffin, female   DOB: 23-Aug-1955, 55 y.o.   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 usual positions she's recently had problems were thrombosed external hemorrhoid that has been bleeding and possibly slightly improved and she call for an appointment she was same by Dr. Sharen Hones at the [facility] the [place] practice 2 days ago and he recommended she be seen in the surgeon's office urine she's had a little bit of drainage and said the swelling has decreased slightly over the past 48 hours she treated herself hemorrhoid creams and tub soaks. She says she takes a lot of aspirin for pain and other issues as not on Coumadin or any other real doses to cause her to bleed   Review of Systems  Neurological: Positive for headaches.  Review of Systems  Neurological: Positive for headaches.       Objective:   Physical ExamBP 114/70  Pulse 62  Temp(Src) 96.8 F (36 C) (Temporal)  Ht 5\' 3"  (1.6 m)  Wt 131 lb 9.6 oz (59.693 kg)  BMI 23.31 kg/m2 On examination prominent limited to the anus the perirectal area she has a large thrombosed hemorrhoid with a necrotic center on the right posterior earlier thrombosed hemorrhoid slightly anterior I think it would best to go and excise these 2 hemorrhoids here with local anesthesia and she was in agreement. Anoscopic exam reveals mild internal hemorrhoids but is predominantly an external thrombosed hemorrhoid problem only. After permission was obtained she was positioned in the lateral position the area anesthetized with Xylocaine 1% with adrenaline after little Betadine prep and then I removed this thrombosed hemorrhoid and the second one anterior I removed it also it was a little more bleeding because of aspirin digits suture the area at the apex with a figure-of-eight 4-0 chromic on both the hemorrhoids. I expect her approximately 10-15 minutes later there  was no bleeding she was comfortable and I think she can be released recommend not to do any strenuous activity over the next 3-4 days and I will have her prescription for Vicodin if she needs to obtain warm soaks plus and lidocaine ointment can continue using a hemorrhoid ointment if she desires      Assessment:    2 external thrombosed hemorrhoids that were removed with the pedicle suture in treat with stool softeners and avoidance of heavy lifting the next 3-4 days. I would like to see her again in approximately 2-3 weeks and she may have internal hemorrhoids the knees procedure hopefully there'll be no followup acute problem     Plan:    Followup in 2-3 with a limit her activity is with heavy lifting etc. over the next 3-4 days Vicodin if needed for pain plus they have oral ointments or lidocaine or

## 2010-12-09 ENCOUNTER — Other Ambulatory Visit: Payer: Self-pay | Admitting: Internal Medicine

## 2010-12-17 ENCOUNTER — Other Ambulatory Visit: Payer: Self-pay

## 2010-12-17 NOTE — Telephone Encounter (Signed)
[facility] faxed refill request for Hydrocodone APAP [phone]. Faxed request is in your in box on your desk.

## 2010-12-18 MED ORDER — HYDROCODONE-ACETAMINOPHEN 10-650 MG PO TABS: ORAL_TABLET | ORAL | Status: DC

## 2010-12-18 NOTE — Telephone Encounter (Signed)
[facility] 

## 2010-12-18 NOTE — Telephone Encounter (Signed)
Okay #30 x 1 

## 2010-12-25 ENCOUNTER — Ambulatory Visit (INDEPENDENT_AMBULATORY_CARE_PROVIDER_SITE_OTHER): Payer: 59 | Admitting: General Surgery

## 2011-01-01 ENCOUNTER — Ambulatory Visit (INDEPENDENT_AMBULATORY_CARE_PROVIDER_SITE_OTHER): Payer: 59 | Admitting: General Surgery

## 2011-01-04 ENCOUNTER — Ambulatory Visit (INDEPENDENT_AMBULATORY_CARE_PROVIDER_SITE_OTHER): Payer: 59 | Admitting: General Surgery

## 2011-01-20 ENCOUNTER — Ambulatory Visit: Payer: Self-pay | Admitting: Internal Medicine

## 2011-01-25 ENCOUNTER — Encounter: Payer: Self-pay | Admitting: Internal Medicine

## 2011-01-27 ENCOUNTER — Encounter: Payer: Self-pay | Admitting: *Deleted

## 2011-02-15 ENCOUNTER — Other Ambulatory Visit: Payer: Self-pay | Admitting: *Deleted

## 2011-02-16 MED ORDER — HYDROCODONE-ACETAMINOPHEN 10-650 MG PO TABS: ORAL_TABLET | ORAL | Status: DC

## 2011-02-16 NOTE — Telephone Encounter (Signed)
rx called into pharmacy

## 2011-02-16 NOTE — Telephone Encounter (Signed)
Okay #30 x 1 

## 2011-04-21 ENCOUNTER — Other Ambulatory Visit: Payer: Self-pay | Admitting: Internal Medicine

## 2011-04-21 NOTE — Telephone Encounter (Signed)
Patient requesting refill. 

## 2011-04-22 MED ORDER — HYDROCODONE-ACETAMINOPHEN 10-650 MG PO TABS: ORAL_TABLET | ORAL | Status: DC

## 2011-04-22 NOTE — Telephone Encounter (Signed)
rx called into pharmacy

## 2011-04-22 NOTE — Telephone Encounter (Signed)
Okay #30 x 2 

## 2011-06-09 ENCOUNTER — Other Ambulatory Visit: Payer: Self-pay | Admitting: Internal Medicine

## 2011-07-12 ENCOUNTER — Other Ambulatory Visit: Payer: Self-pay | Admitting: *Deleted

## 2011-07-12 MED ORDER — HYDROCODONE-ACETAMINOPHEN 10-650 MG PO TABS: ORAL_TABLET | ORAL | Status: DC

## 2011-07-12 NOTE — Telephone Encounter (Signed)
rx called into pharmacy

## 2011-07-12 NOTE — Telephone Encounter (Signed)
Okay #30 x 1 

## 2011-07-12 NOTE — Telephone Encounter (Signed)
[facility] faxed request Hydrocodone-APAP [phone]. Pt last seen 12/01/10 by Dr Sharen Hones.

## 2011-09-07 ENCOUNTER — Other Ambulatory Visit: Payer: Self-pay | Admitting: *Deleted

## 2011-09-07 MED ORDER — HYDROCODONE-ACETAMINOPHEN 10-650 MG PO TABS: 0.5000 | ORAL_TABLET | Freq: Every evening | ORAL | Status: DC | PRN

## 2011-09-07 NOTE — Telephone Encounter (Signed)
rx called into pharmacy

## 2011-09-07 NOTE — Telephone Encounter (Signed)
Okay #30 x 1 

## 2011-10-08 ENCOUNTER — Ambulatory Visit: Payer: 59 | Admitting: Internal Medicine

## 2011-10-11 ENCOUNTER — Encounter: Payer: Self-pay | Admitting: Internal Medicine

## 2011-10-11 ENCOUNTER — Ambulatory Visit (INDEPENDENT_AMBULATORY_CARE_PROVIDER_SITE_OTHER): Payer: 59 | Admitting: Internal Medicine

## 2011-10-11 VITALS — BP 100/60 | HR 73 | Temp 98.0°F | Ht 63.0 in | Wt 127.0 lb

## 2011-10-11 DIAGNOSIS — Z0000 Encounter for general adult medical examination without abnormal findings: Secondary | ICD-10-CM

## 2011-10-11 DIAGNOSIS — E785 Hyperlipidemia, unspecified: Secondary | ICD-10-CM

## 2011-10-11 DIAGNOSIS — E039 Hypothyroidism, unspecified: Secondary | ICD-10-CM

## 2011-10-11 DIAGNOSIS — M549 Dorsalgia, unspecified: Secondary | ICD-10-CM

## 2011-10-11 MED ORDER — HYDROCODONE-ACETAMINOPHEN 10-650 MG PO TABS: 1.0000 | ORAL_TABLET | Freq: Two times a day (BID) | ORAL | Status: DC | PRN

## 2011-10-11 NOTE — Assessment & Plan Note (Signed)
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

## 2011-10-11 NOTE — Progress Notes (Signed)
Subjective:    Patient ID: Catherine Griffin, female    DOB: [birth year], 56 y.o.   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 gabapentin (NEURONTIN) 300 MG capsule TAKE 1-2 CAPSULES AT BEDTIME TO HELP NERVE PAIN  60 capsule  5    Allergies  Allergen Reactions  . Indomethacin     REACTION: Rash/hives/swelling    Past Medical History  Diagnosis Date  . Osteoporosis     04/25/07  . Thyroid disease     hypothyrodism  . Arthritis   . Lichen sclerosus   . Back pain     04/25/07  . Actinic keratosis     04/25/07  . Vaginitis     10/01/10  . Diverticulosis of colon     02/28/09  . Fatigue     05/30/08  . Chest pain     04/25/07  . RUQ abdominal pain     02/07/09  . Hemorrhoid     12/01/10  . Abdominal pain     Past Surgical History  Procedure Date  . Abdominal hysterectomy     Family History  Problem Relation Age of Onset  . Heart disease Father   . Arthritis Father   . Other Father     joint replacement  . Coronary artery disease Neg Hx   . Diabetes Neg Hx   . Cancer Mother     lung  . Anemia Sister   . Other Sister     CHF    History   Social History  . Marital Status: Married    Spouse Name: N/A    Number of Children: N/A  . Years of Education: N/A   Occupational History  . runs pool service for 7 locations    Social History Main Topics  . Smoking status: Current Everyday Smoker    Types: Cigarettes  . Smokeless tobacco: Never Used  . Alcohol Use: No  . Drug Use: No  . Sexually Active: Not on  file   Other Topics Concern  . Not on file   Social History Narrative   Daily caffeine use- 2 drinks dailyPatient does not get regular exercise      Review of Systems  Constitutional: Negative for fatigue and unexpected weight change.       Wears seat belt  HENT: Negative for hearing loss, congestion, rhinorrhea, dental problem and tinnitus.        Regular with dentist   Eyes: Negative for visual disturbance.       No diplopia or unilateral vision loss  Respiratory: Positive for cough. Negative for chest tightness and shortness of breath.        Chronic smokers cough  Cardiovascular: Positive for leg swelling. Negative for chest pain and palpitations.       Occ swelling if persistent sitting---resolves quickly  Gastrointestinal: Positive for constipation. Negative for nausea, vomiting, abdominal pain and blood in stool.       Abdominal pain has gone away No sig heartburn Occ needs fiber  for constipation  Genitourinary: Negative for dysuria, urgency, difficulty urinating and dyspareunia.  Musculoskeletal: Positive for back pain and arthralgias. Negative for joint swelling.       Some knee pain Stiffness after prolonged sitting  Skin: Negative for rash.       No suspicious lesions  Neurological: Positive for numbness. Negative for dizziness, weakness, light-headedness and headaches.       Will gets some tingling and numbness in legs>hands associated with back pain  Hematological: Negative for adenopathy. Does not bruise/bleed easily.  Psychiatric/Behavioral: Positive for disturbed wake/sleep cycle. Negative for dysphoric mood. The patient is nervous/anxious.        Usually sleeps only 5 hours at night--no real change Tired in summer due to working 7 days per week Gets stress with work---worries with family health issues---there have been some deaths      Objective:   Physical Exam  Constitutional: She is oriented to person, place, and time. She appears well-nourished. No  distress.  HENT:  Head: Normocephalic and atraumatic.  Right Ear: External ear normal.  Left Ear: External ear normal.  Mouth/Throat: Oropharynx is clear and moist. No oropharyngeal exudate.  Eyes: Conjunctivae and EOM are normal. Pupils are equal, round, and reactive to light.  Neck: Normal range of motion. Neck supple. No thyromegaly present.  Cardiovascular: Normal rate, regular rhythm, normal heart sounds and intact distal pulses.  Exam reveals no gallop.   No murmur heard. Pulmonary/Chest: Effort normal and breath sounds normal. No respiratory distress. She has no wheezes. She has no rales.  Abdominal: Soft. There is no tenderness.  Genitourinary:       Mild cystic changes in both breasts  Musculoskeletal: She exhibits no edema and no tenderness.  Lymphadenopathy:    She has no cervical adenopathy.    She has no axillary adenopathy.  Neurological: She is alert and oriented to person, place, and time.  Skin: No rash noted. No erythema.       Discussed sun block,etc  Psychiatric: She has a normal mood and affect. Her behavior is normal.          Assessment & Plan:

## 2011-10-11 NOTE — Assessment & Plan Note (Signed)
Hasn't needed meds Will check labs 

## 2011-10-11 NOTE — Assessment & Plan Note (Addendum)
Generally healthy Discussed cigarette cessation Will check labs Defer mammo to next year UTD on colon Had hyster so no pap

## 2011-10-11 NOTE — Assessment & Plan Note (Signed)
Discussed primary prevention Will not use meds

## 2011-10-12 LAB — BASIC METABOLIC PANEL
BUN: 15 mg/dL (ref 6–23)
Calcium: 9 mg/dL (ref 8.4–10.5)
Creatinine, Ser: 0.7 mg/dL (ref 0.4–1.2)
GFR: 88.37 mL/min (ref >60.00)
Glucose, Bld: 151 mg/dL — ABNORMAL HIGH (ref 70–99)
Potassium: 3.4 mEq/L — ABNORMAL LOW (ref 3.5–5.1)

## 2011-10-12 LAB — TSH: TSH: 1.5 u[IU]/mL (ref 0.35–5.50)

## 2011-10-12 LAB — CBC WITH DIFFERENTIAL/PLATELET
Basophils Absolute: 0 10*3/uL (ref 0.0–0.1)
Eosinophils Relative: 4.7 % (ref 0.0–5.0)
HCT: 39.3 % (ref 36.0–46.0)
Lymphs Abs: 2.4 10*3/uL (ref 0.7–4.0)
Monocytes Relative: 7 % (ref 3.0–12.0)
Neutrophils Relative %: 54 % (ref 43.0–77.0)
Platelets: 188 10*3/uL (ref 150.0–400.0)
RDW: 13.6 % (ref 11.5–14.6)
WBC: 7.2 10*3/uL (ref 4.5–10.5)

## 2011-10-12 LAB — T4, FREE: Free T4: 0.68 ng/dL (ref 0.60–1.60)

## 2011-10-12 LAB — HEPATIC FUNCTION PANEL
Albumin: 3.6 g/dL (ref 3.5–5.2)
Alkaline Phosphatase: 53 U/L (ref 39–117)
Total Bilirubin: 0.3 mg/dL (ref 0.3–1.2)

## 2011-10-12 LAB — LIPID PANEL
Cholesterol: 210 mg/dL — ABNORMAL HIGH (ref 0–200)
HDL: 48.8 mg/dL (ref >39.00)
Triglycerides: 127 mg/dL (ref 0.0–149.0)
VLDL: 25.4 mg/dL (ref 0.0–40.0)

## 2011-10-18 ENCOUNTER — Encounter: Payer: Self-pay | Admitting: *Deleted

## 2011-12-03 ENCOUNTER — Other Ambulatory Visit: Payer: Self-pay | Admitting: *Deleted

## 2011-12-03 MED ORDER — HYDROCODONE-ACETAMINOPHEN 10-650 MG PO TABS: 1.0000 | ORAL_TABLET | Freq: Two times a day (BID) | ORAL | Status: DC | PRN

## 2011-12-03 NOTE — Telephone Encounter (Signed)
Last filled 10/11/11

## 2011-12-03 NOTE — Telephone Encounter (Signed)
rx called into pharmacy

## 2011-12-03 NOTE — Telephone Encounter (Signed)
Okay #60 x 0 

## 2012-01-19 ENCOUNTER — Other Ambulatory Visit: Payer: Self-pay | Admitting: *Deleted

## 2012-01-19 MED ORDER — HYDROCODONE-ACETAMINOPHEN 10-650 MG PO TABS: 1.0000 | ORAL_TABLET | Freq: Two times a day (BID) | ORAL | Status: DC | PRN

## 2012-01-19 NOTE — Telephone Encounter (Signed)
rx called into pharmacy

## 2012-01-19 NOTE — Telephone Encounter (Signed)
Last refill 12/03/11

## 2012-01-19 NOTE — Telephone Encounter (Signed)
Okay #60 x 0 

## 2012-03-01 ENCOUNTER — Other Ambulatory Visit: Payer: Self-pay | Admitting: *Deleted

## 2012-03-01 MED ORDER — HYDROCODONE-ACETAMINOPHEN 10-650 MG PO TABS: 1.0000 | ORAL_TABLET | Freq: Two times a day (BID) | ORAL | Status: DC | PRN

## 2012-03-01 NOTE — Telephone Encounter (Signed)
rx called into pharmacy

## 2012-03-01 NOTE — Telephone Encounter (Signed)
Okay #60 x 0 

## 2012-04-09 ENCOUNTER — Other Ambulatory Visit: Payer: Self-pay | Admitting: Internal Medicine

## 2012-04-10 NOTE — Telephone Encounter (Signed)
Okay #60 x 0 

## 2012-04-10 NOTE — Telephone Encounter (Signed)
rx called into pharmacy

## 2012-05-22 ENCOUNTER — Other Ambulatory Visit: Payer: Self-pay | Admitting: Internal Medicine

## 2012-05-22 NOTE — Telephone Encounter (Signed)
[facility] PATIENT, last filled 04/09/2012

## 2012-05-22 NOTE — Telephone Encounter (Signed)
rx called into pharmacy

## 2012-05-22 NOTE — Telephone Encounter (Signed)
plz phone in. 

## 2012-06-23 ENCOUNTER — Other Ambulatory Visit: Payer: Self-pay | Admitting: Internal Medicine

## 2012-06-26 ENCOUNTER — Other Ambulatory Visit: Payer: Self-pay | Admitting: Family Medicine

## 2012-06-26 NOTE — Telephone Encounter (Signed)
rx called into pharmacy

## 2012-06-26 NOTE — Telephone Encounter (Signed)
Ok to refill? Last filled 05/22/12 and last OV 10/11/11.

## 2012-06-26 NOTE — Telephone Encounter (Signed)
Okay #60 x 0 

## 2012-07-29 ENCOUNTER — Other Ambulatory Visit: Payer: Self-pay | Admitting: Internal Medicine

## 2012-08-01 MED ORDER — HYDROCODONE-ACETAMINOPHEN 10-325 MG PO TABS: 1.0000 | ORAL_TABLET | Freq: Two times a day (BID) | ORAL | Status: DC | PRN

## 2012-08-01 NOTE — Telephone Encounter (Signed)
1 bid prn That is the way it is on the med list now

## 2012-08-01 NOTE — Telephone Encounter (Signed)
rx called into pharmacy

## 2012-08-01 NOTE — Telephone Encounter (Signed)
See the new dose 10/325 Okay #60 x 0

## 2012-08-01 NOTE — Telephone Encounter (Signed)
Should the instructions be the same for the 10-325 as it was for 5-325?take 2 tablets twice daily?

## 2012-08-29 ENCOUNTER — Other Ambulatory Visit: Payer: Self-pay | Admitting: Internal Medicine

## 2012-08-29 NOTE — Telephone Encounter (Signed)
Electronic refill request.  Please advise. 

## 2012-08-30 NOTE — Telephone Encounter (Signed)
Okay #60 x 0 

## 2012-08-30 NOTE — Telephone Encounter (Signed)
rx called into pharmacy

## 2012-09-28 ENCOUNTER — Other Ambulatory Visit: Payer: Self-pay | Admitting: Internal Medicine

## 2012-09-29 NOTE — Telephone Encounter (Signed)
Please call in.  Thanks.   

## 2012-09-29 NOTE — Telephone Encounter (Signed)
Medication phoned to pharmacy.  

## 2012-10-12 ENCOUNTER — Encounter: Payer: Self-pay | Admitting: *Deleted

## 2012-10-16 ENCOUNTER — Ambulatory Visit (INDEPENDENT_AMBULATORY_CARE_PROVIDER_SITE_OTHER): Payer: 59 | Admitting: Internal Medicine

## 2012-10-16 ENCOUNTER — Encounter: Payer: Self-pay | Admitting: Internal Medicine

## 2012-10-16 VITALS — BP 100/60 | HR 84 | Temp 98.0°F | Wt 138.0 lb

## 2012-10-16 DIAGNOSIS — E785 Hyperlipidemia, unspecified: Secondary | ICD-10-CM

## 2012-10-16 DIAGNOSIS — R490 Dysphonia: Secondary | ICD-10-CM | POA: Insufficient documentation

## 2012-10-16 DIAGNOSIS — E039 Hypothyroidism, unspecified: Secondary | ICD-10-CM

## 2012-10-16 DIAGNOSIS — M549 Dorsalgia, unspecified: Secondary | ICD-10-CM

## 2012-10-16 DIAGNOSIS — Z0000 Encounter for general adult medical examination without abnormal findings: Secondary | ICD-10-CM

## 2012-10-16 DIAGNOSIS — F17200 Nicotine dependence, unspecified, uncomplicated: Secondary | ICD-10-CM

## 2012-10-16 NOTE — Assessment & Plan Note (Signed)
Generally healthy Due for mammo later this year Advised yearly flu vaccine

## 2012-10-16 NOTE — Assessment & Plan Note (Signed)
Counseled 4-5 minutes Info given On patch but still smoking

## 2012-10-16 NOTE — Assessment & Plan Note (Signed)
Has been okay without meds Will check labs

## 2012-10-16 NOTE — Assessment & Plan Note (Signed)
Discussed No meds for primary prevention

## 2012-10-16 NOTE — Assessment & Plan Note (Signed)
Chronic Uses the hydrocodone regularly

## 2012-10-16 NOTE — Progress Notes (Signed)
Subjective:    Patient ID: Catherine Griffin, female    DOB: 1956-01-26, 57 y.o.   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 gabapentin (NEURONTIN) 300 MG capsule TAKE 1-2 CAPSULES AT BEDTIME TO HELP NERVE PAIN  60 capsule  3  . HYDROcodone-acetaminophen (NORCO) 10-325 MG per tablet TAKE 1 TABLET BY MOUTH TWICE A DAY  60 tablet  0  . ibuprofen (ADVIL,MOTRIN) 200 MG tablet Take 400 mg by mouth 3 (three) times daily.       No current facility-administered medications on file prior to visit.    Allergies  Allergen Reactions  . Indomethacin     REACTION: Rash/hives/swelling    Past Medical History  Diagnosis Date  . Osteoporosis     04/25/07  . Thyroid disease     hypothyrodism  . Arthritis   . Lichen sclerosus   . Back pain     04/25/07  . Actinic keratosis     04/25/07  . Vaginitis     10/01/10  . Diverticulosis of colon     02/28/09  . Fatigue     05/30/08  . Chest pain     04/25/07  . RUQ abdominal pain     02/07/09  . Hemorrhoid     12/01/10  . Abdominal pain   . Hyperlipidemia     Past Surgical History  Procedure Laterality Date  . Abdominal hysterectomy      Family History  Problem Relation Age of Onset  . Heart disease Father   . Arthritis Father   . Other Father     joint replacement  . Coronary artery disease Neg Hx   . Diabetes Neg Hx   . Cancer Mother     lung  . Anemia Sister   . Other Sister     CHF    History   Social History  . Marital Status: Married    Spouse Name: N/A   Number of Children: N/A  . Years of Education: N/A   Occupational History  . runs pool service for 7 locations    Social History Main Topics  . Smoking status: Current Every Day Smoker    Types: Cigarettes  . Smokeless tobacco: Never Used  . Alcohol Use: No  . Drug Use: No  . Sexually Active: Not on file   Other Topics Concern  . Not on file   Social History Narrative   Daily caffeine use- 2 drinks daily   Patient does not get regular exercise   Review of Systems  Constitutional: Negative for fatigue and unexpected weight change.       Wears seat belt  HENT: Negative for hearing loss, congestion, rhinorrhea, dental problem and tinnitus.        Regular with dentist Some dryness in mouth  Eyes: Negative for visual disturbance.       No diplopia or unilateral vision loss   Respiratory: Negative for cough, chest tightness and shortness of breath.   Cardiovascular: Negative for chest pain, palpitations and leg  swelling.  Gastrointestinal: Negative for nausea, vomiting, abdominal pain, constipation and blood in stool.       Bowels better with dulcolax or miralax No heartburn  Endocrine: Negative for cold intolerance and heat intolerance.  Genitourinary: Negative for dysuria, hematuria, difficulty urinating and dyspareunia.  Musculoskeletal: Positive for back pain and arthralgias. Negative for joint swelling.       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       No suspicious lesions  Allergic/Immunologic: Negative for environmental allergies and immunocompromised state.  Neurological: Positive for numbness. Negative for dizziness, syncope, weakness, light-headedness and headaches.       Burning in legs at night  Hematological: Negative for adenopathy. Bruises/bleeds easily.  Psychiatric/Behavioral: Positive for sleep disturbance. Negative for dysphoric mood. The patient is nervous/anxious.        Can fall asleep but trouble  staying asleep Stress with caring for ill older sister---now in rest home but may need care       Objective:   Physical Exam  Constitutional: She is oriented to person, place, and time. She appears well-developed and well-nourished. No distress.  HENT:  Head: Normocephalic and atraumatic.  Right Ear: External ear normal.  Left Ear: External ear normal.  Mouth/Throat: Oropharynx is clear and moist. No oropharyngeal exudate.  Eyes: Conjunctivae and EOM are normal. Pupils are equal, round, and reactive to light.  Neck: Normal range of motion. Neck supple. No thyromegaly present.  Cardiovascular: Normal rate, regular rhythm, normal heart sounds and intact distal pulses.  Exam reveals no gallop.   No murmur heard. Pulmonary/Chest: Effort normal and breath sounds normal. No respiratory distress. She has no wheezes. She has no rales.  Abdominal: Soft. There is no tenderness.  Genitourinary:  Moderate cystic changes in both breasts but no worrisome lesions  Musculoskeletal: She exhibits no edema and no tenderness.  Lymphadenopathy:    She has no cervical adenopathy.    She has no axillary adenopathy.  Neurological: She is alert and oriented to person, place, and time.  Skin: No rash noted. No erythema.  Psychiatric: She has a normal mood and affect. Her behavior is normal.          Assessment & Plan:

## 2012-10-16 NOTE — Patient Instructions (Addendum)
Please set up your screening mammogram in October Please get your flu shot this year---also for October. Please call 1-800 QUIT NOW for help in quitting smoking completely.

## 2012-10-16 NOTE — Assessment & Plan Note (Signed)
Will get ENT evaluation since she is a smoker

## 2012-10-17 LAB — CBC WITH DIFFERENTIAL/PLATELET
Eosinophils Absolute: 0.1 10*3/uL (ref 0.0–0.7)
Eosinophils Relative: 1.7 % (ref 0.0–5.0)
HCT: 40.3 % (ref 36.0–46.0)
Lymphs Abs: 3 10*3/uL (ref 0.7–4.0)
MCHC: 34.4 g/dL (ref 30.0–36.0)
MCV: 92.4 fl (ref 78.0–100.0)
Monocytes Absolute: 0.6 10*3/uL (ref 0.1–1.0)
Neutrophils Relative %: 49.6 % (ref 43.0–77.0)
Platelets: 206 10*3/uL (ref 150.0–400.0)
RDW: 14.1 % (ref 11.5–14.6)

## 2012-10-17 LAB — BASIC METABOLIC PANEL
BUN: 19 mg/dL (ref 6–23)
CO2: 27 mEq/L (ref 19–32)
Chloride: 105 mEq/L (ref 96–112)
Creatinine, Ser: 0.9 mg/dL (ref 0.4–1.2)
Glucose, Bld: 85 mg/dL (ref 70–99)
Potassium: 3.9 mEq/L (ref 3.5–5.1)

## 2012-10-17 LAB — HEPATIC FUNCTION PANEL
AST: 22 U/L (ref 0–37)
Albumin: 3.9 g/dL (ref 3.5–5.2)
Total Bilirubin: 0.1 mg/dL — ABNORMAL LOW (ref 0.3–1.2)

## 2012-10-17 LAB — TSH: TSH: 1.39 u[IU]/mL (ref 0.35–5.50)

## 2012-10-26 ENCOUNTER — Other Ambulatory Visit: Payer: Self-pay | Admitting: Family Medicine

## 2012-10-27 NOTE — Telephone Encounter (Signed)
rx called into pharmacy

## 2012-10-27 NOTE — Telephone Encounter (Signed)
To PCP

## 2012-10-27 NOTE — Telephone Encounter (Signed)
Okay #60 x 0 

## 2012-10-30 ENCOUNTER — Encounter: Payer: Self-pay | Admitting: Internal Medicine

## 2012-10-30 NOTE — Telephone Encounter (Signed)
[facility]-300 and I advised that is not what I called in, they made a mistake and will fill the 10-325 and have it ready today. I send email to patient letting her know this.

## 2012-10-31 ENCOUNTER — Encounter: Payer: Self-pay | Admitting: Internal Medicine

## 2012-11-28 ENCOUNTER — Other Ambulatory Visit: Payer: Self-pay | Admitting: Internal Medicine

## 2012-11-29 NOTE — Telephone Encounter (Signed)
rx called into pharmacy

## 2012-11-29 NOTE — Telephone Encounter (Signed)
Okay #60 x 0 

## 2012-12-27 ENCOUNTER — Other Ambulatory Visit: Payer: Self-pay | Admitting: Internal Medicine

## 2012-12-27 NOTE — Telephone Encounter (Signed)
Last filled 11/28/12 

## 2012-12-27 NOTE — Telephone Encounter (Signed)
Okay #60 x 0 

## 2012-12-28 NOTE — Telephone Encounter (Signed)
rx called into pharmacy

## 2013-01-01 NOTE — Telephone Encounter (Signed)
[facility] pharmacist [facility] wanted to clarify hydrocodone quantity that was called in 12/28/12. Verified # 60 x 0. Charles voiced understanding.

## 2013-01-24 ENCOUNTER — Encounter: Payer: Self-pay | Admitting: Internal Medicine

## 2013-01-24 MED ORDER — HYDROCODONE-ACETAMINOPHEN 10-325 MG PO TABS: 1.0000 | ORAL_TABLET | Freq: Two times a day (BID) | ORAL | Status: DC | PRN

## 2013-01-24 NOTE — Telephone Encounter (Signed)
Spoke with patient and advised rx ready for pick-up and it will be at the front desk.  

## 2013-01-24 NOTE — Telephone Encounter (Signed)
Let her know about the new rules

## 2013-01-24 NOTE — Telephone Encounter (Signed)
Last filled 12/27/2012

## 2013-01-26 NOTE — Telephone Encounter (Signed)
ok 

## 2013-02-15 ENCOUNTER — Other Ambulatory Visit: Payer: Self-pay

## 2013-02-19 ENCOUNTER — Encounter: Payer: Self-pay | Admitting: Internal Medicine

## 2013-02-19 MED ORDER — HYDROCODONE-ACETAMINOPHEN 10-325 MG PO TABS: 1.0000 | ORAL_TABLET | Freq: Two times a day (BID) | ORAL | Status: DC | PRN

## 2013-02-20 ENCOUNTER — Encounter: Payer: Self-pay | Admitting: Internal Medicine

## 2013-03-21 ENCOUNTER — Encounter: Payer: Self-pay | Admitting: Internal Medicine

## 2013-03-21 MED ORDER — HYDROCODONE-ACETAMINOPHEN 10-325 MG PO TABS: 1.0000 | ORAL_TABLET | Freq: Two times a day (BID) | ORAL | Status: DC | PRN

## 2013-03-21 NOTE — Telephone Encounter (Signed)
Sent patient message back thru my-chart, advised to call or resend message if pt has any questions.  

## 2013-03-21 NOTE — Telephone Encounter (Addendum)
Last filled 02/19/13 

## 2013-04-10 ENCOUNTER — Other Ambulatory Visit: Payer: Self-pay | Admitting: Internal Medicine

## 2013-04-18 ENCOUNTER — Encounter: Payer: Self-pay | Admitting: Internal Medicine

## 2013-04-19 MED ORDER — HYDROCODONE-ACETAMINOPHEN 10-325 MG PO TABS: 1.0000 | ORAL_TABLET | Freq: Two times a day (BID) | ORAL | Status: DC | PRN

## 2013-04-19 NOTE — Telephone Encounter (Signed)
Last filled12/10/14 

## 2013-04-19 NOTE — Telephone Encounter (Signed)
Sent patient message back thru my-chart, advised to call or resend message if pt has any questions.  

## 2013-04-20 ENCOUNTER — Ambulatory Visit: Payer: Self-pay | Admitting: Internal Medicine

## 2013-04-23 ENCOUNTER — Encounter: Payer: Self-pay | Admitting: Internal Medicine

## 2013-05-18 ENCOUNTER — Encounter: Payer: Self-pay | Admitting: Internal Medicine

## 2013-05-21 MED ORDER — HYDROCODONE-ACETAMINOPHEN 10-325 MG PO TABS: 1.0000 | ORAL_TABLET | Freq: Two times a day (BID) | ORAL | Status: DC | PRN

## 2013-06-18 ENCOUNTER — Encounter: Payer: Self-pay | Admitting: Internal Medicine

## 2013-06-18 NOTE — Telephone Encounter (Signed)
Last filled 05/21/13

## 2013-06-19 ENCOUNTER — Encounter: Payer: Self-pay | Admitting: Internal Medicine

## 2013-06-19 MED ORDER — HYDROCODONE-ACETAMINOPHEN 10-325 MG PO TABS: 1.0000 | ORAL_TABLET | Freq: Two times a day (BID) | ORAL | Status: DC | PRN

## 2013-06-19 NOTE — Telephone Encounter (Signed)
Patient notified by telephone that script is up front ready for pickup. 

## 2013-06-19 NOTE — Telephone Encounter (Signed)
We talked by telephone and this was taken care of. It was about script being ready for pickup.

## 2013-06-19 NOTE — Telephone Encounter (Signed)
Pt left v/m requesting cb at [phone].

## 2013-06-19 NOTE — Telephone Encounter (Signed)
Prescription is up front and ready for pickup. Left message on answering machine to call back.

## 2013-06-25 ENCOUNTER — Encounter: Payer: Self-pay | Admitting: Internal Medicine

## 2013-07-18 ENCOUNTER — Encounter: Payer: Self-pay | Admitting: Internal Medicine

## 2013-07-18 MED ORDER — HYDROCODONE-ACETAMINOPHEN 10-325 MG PO TABS: 1.0000 | ORAL_TABLET | Freq: Two times a day (BID) | ORAL | Status: DC | PRN

## 2013-07-18 NOTE — Telephone Encounter (Signed)
She is going to have her husband Lorella NimrodHarvey pick this up tomorrow

## 2013-08-15 ENCOUNTER — Encounter: Payer: Self-pay | Admitting: Internal Medicine

## 2013-08-16 ENCOUNTER — Ambulatory Visit (INDEPENDENT_AMBULATORY_CARE_PROVIDER_SITE_OTHER): Payer: No Typology Code available for payment source | Admitting: Internal Medicine

## 2013-08-16 ENCOUNTER — Encounter: Payer: Self-pay | Admitting: Internal Medicine

## 2013-08-16 ENCOUNTER — Ambulatory Visit (INDEPENDENT_AMBULATORY_CARE_PROVIDER_SITE_OTHER)
Admission: RE | Admit: 2013-08-16 | Discharge: 2013-08-16 | Disposition: A | Discharge status: Home / Self Care | Payer: No Typology Code available for payment source | Source: Ambulatory Visit | Attending: Internal Medicine | Admitting: Internal Medicine

## 2013-08-16 VITALS — BP 106/64 | HR 72 | Temp 97.9°F | Wt 144.5 lb

## 2013-08-16 DIAGNOSIS — S8012XA Contusion of left lower leg, initial encounter: Secondary | ICD-10-CM

## 2013-08-16 DIAGNOSIS — M25519 Pain in unspecified shoulder: Secondary | ICD-10-CM

## 2013-08-16 DIAGNOSIS — S8010XA Contusion of unspecified lower leg, initial encounter: Secondary | ICD-10-CM

## 2013-08-16 DIAGNOSIS — Y92009 Unspecified place in unspecified non-institutional (private) residence as the place of occurrence of the external cause: Secondary | ICD-10-CM

## 2013-08-16 DIAGNOSIS — W19XXXA Unspecified fall, initial encounter: Secondary | ICD-10-CM

## 2013-08-16 DIAGNOSIS — M25511 Pain in right shoulder: Secondary | ICD-10-CM

## 2013-08-16 LAB — COMPREHENSIVE METABOLIC PANEL
ALBUMIN: 3.8 g/dL (ref 3.5–5.2)
ALT: 18 U/L (ref 0–35)
AST: 29 U/L (ref 0–37)
Alkaline Phosphatase: 48 U/L (ref 39–117)
BUN: 18 mg/dL (ref 6–23)
CO2: 27 meq/L (ref 19–32)
Calcium: 8.9 mg/dL (ref 8.4–10.5)
Chloride: 109 mEq/L (ref 96–112)
Creatinine, Ser: 0.9 mg/dL (ref 0.4–1.2)
GFR: 65.36 mL/min (ref >60.00)
Glucose, Bld: 91 mg/dL (ref 70–99)
POTASSIUM: 3.6 meq/L (ref 3.5–5.1)
Sodium: 143 mEq/L (ref 135–145)
Total Bilirubin: 0.4 mg/dL (ref 0.2–1.2)
Total Protein: 6.7 g/dL (ref 6.0–8.3)

## 2013-08-16 LAB — CBC WITH DIFFERENTIAL/PLATELET
Basophils Absolute: 0 10*3/uL (ref 0.0–0.1)
Basophils Relative: 0.5 % (ref 0.0–3.0)
EOS ABS: 0.2 10*3/uL (ref 0.0–0.7)
EOS PCT: 3 % (ref 0.0–5.0)
HCT: 37.4 % (ref 36.0–46.0)
HEMOGLOBIN: 12.6 g/dL (ref 12.0–15.0)
LYMPHS PCT: 41.6 % (ref 12.0–46.0)
Lymphs Abs: 2.5 10*3/uL (ref 0.7–4.0)
MCHC: 33.6 g/dL (ref 30.0–36.0)
MCV: 92.8 fl (ref 78.0–100.0)
Monocytes Absolute: 0.5 10*3/uL (ref 0.1–1.0)
Monocytes Relative: 8.2 % (ref 3.0–12.0)
NEUTROS PCT: 46.7 % (ref 43.0–77.0)
Neutro Abs: 2.8 10*3/uL (ref 1.4–7.7)
Platelets: 213 10*3/uL (ref 150.0–400.0)
RBC: 4.03 Mil/uL (ref 3.87–5.11)
RDW: 14.2 % (ref 11.5–15.5)
WBC: 6 10*3/uL (ref 4.0–10.5)

## 2013-08-16 MED ORDER — HYDROCODONE-ACETAMINOPHEN 10-325 MG PO TABS: 1.0000 | ORAL_TABLET | Freq: Two times a day (BID) | ORAL | Status: DC | PRN

## 2013-08-16 NOTE — Patient Instructions (Addendum)

## 2013-08-16 NOTE — Progress Notes (Signed)
Subjective:    Patient ID: Catherine IvanMarie S Griffin, female    DOB: [birth year], 58 y.o.   MRN: [id number]  HPI  Pt presents to the clinic today with c/o right shoulder pain. She noticed this 1 week ago. She feel at home over an ottoman and bruised her right shoulder and collar bone. She also has some bruising of her left lower leg. She has tried ibuprofen and norco with little relief. She did take 2 of her husbands flexeril with some relief.  Review of Systems      Past Medical History  Diagnosis Date  . Osteoporosis     04/25/07  . Thyroid disease     hypothyrodism  . Arthritis   . Lichen sclerosus   . Back pain     04/25/07  . Actinic keratosis     04/25/07  . Vaginitis     10/01/10  . Diverticulosis of colon     02/28/09  . Fatigue     05/30/08  . Chest pain     04/25/07  . RUQ abdominal pain     02/07/09  . Hemorrhoid     12/01/10  . Abdominal pain   . Hyperlipidemia     Current Outpatient Prescriptions  Medication Sig Dispense Refill  . cholecalciferol (VITAMIN D) 1000 UNITS tablet Take 1,000 Units by mouth daily.      Marland Kitchen. gabapentin (NEURONTIN) 300 MG capsule TAKE 1 TO 2 CAPSULES BY MOUTH AT BEDTIME TO HELP NERVE PAIN  60 capsule  3  . HYDROcodone-acetaminophen (NORCO) 10-325 MG per tablet Take 1 tablet by mouth 2 (two) times daily as needed.  60 tablet  0  . ibuprofen (ADVIL,MOTRIN) 200 MG tablet Take 400 mg by mouth 3 (three) times daily.       No current facility-administered medications for this visit.    Allergies  Allergen Reactions  . Indomethacin     REACTION: Rash/hives/swelling    Family History  Problem Relation Age of Onset  . Heart disease Father   . Arthritis Father   . Other Father     joint replacement  . Coronary artery disease Neg Hx   . Diabetes Neg Hx   . Cancer Mother     lung  . Anemia Sister   . Other Sister     CHF    History   Social History  . Marital Status: Married    Spouse Name: N/A    Number of Children: N/A  . Years of  Education: N/A   Occupational History  . runs pool service for 7 locations    Social History Main Topics  . Smoking status: Current Every Day Smoker    Types: Cigarettes  . Smokeless tobacco: Never Used  . Alcohol Use: No  . Drug Use: No  . Sexual Activity: Not on file   Other Topics Concern  . Not on file   Social History Narrative   Daily caffeine use- 2 drinks daily   Patient does not get regular exercise     Constitutional: Denies fever, malaise, fatigue, headache or abrupt weight changes.  Skin: Pt reports bruising of left lower leg and right shoulder/clavicle. Musculoskeletal: Pt reports right shoulder pain. Denies difficulty with gait, muscle pain or joint pain and swelling.    No other specific complaints in a complete review of systems (except as listed in HPI above).  Objective:   Physical Exam   BP 106/64  Pulse 72  Temp(Src) 97.9 F (36.6 C) (  Oral)  Wt 144 lb 8 oz (65.545 kg)  SpO2 98% Wt Readings from Last 3 Encounters:  08/16/13 144 lb 8 oz (65.545 kg)  10/16/12 138 lb (62.596 kg)  10/11/11 127 lb (57.607 kg)    General: Appears older than her stated age, in NAD. Skin: significant bruising of left lower leg, right clavicle. Hematome noted of left anterior lower leg. Cardiovascular: Normal rate and rhythm. S1,S2 noted.  No murmur, rubs or gallops noted. No JVD or BLE edema. No carotid bruits noted. Pulmonary/Chest: Normal effort and positive vesicular breath sounds. No respiratory distress. No wheezes, rales or ronchi noted.  Musculoskeletal: Pain with internal rotation. Normal external rotation. Pain with palpation of the right AC joint. Clavicle intact without tenderness.   BMET    Component Value Date/Time   NA 139 10/16/2012 1626   K 3.9 10/16/2012 1626   CL 105 10/16/2012 1626   CO2 27 10/16/2012 1626   GLUCOSE 85 10/16/2012 1626   BUN 19 10/16/2012 1626   CREATININE 0.9 10/16/2012 1626   CALCIUM 8.9 10/16/2012 1626   GFRNONAA 79.00 02/07/2009 1220    GFRAA 96 05/30/2008 0000    Lipid Panel     Component Value Date/Time   CHOL 210* 10/11/2011 1526   TRIG 127.0 10/11/2011 1526   HDL 48.80 10/11/2011 1526   CHOLHDL 4 10/11/2011 1526   VLDL 25.4 10/11/2011 1526    CBC    Component Value Date/Time   WBC 7.5 10/16/2012 1626   RBC 4.36 10/16/2012 1626   HGB 13.9 10/16/2012 1626   HCT 40.3 10/16/2012 1626   PLT 206.0 10/16/2012 1626   MCV 92.4 10/16/2012 1626   MCHC 34.4 10/16/2012 1626   RDW 14.1 10/16/2012 1626   LYMPHSABS 3.0 10/16/2012 1626   MONOABS 0.6 10/16/2012 1626   EOSABS 0.1 10/16/2012 1626   BASOSABS 0.0 10/16/2012 1626    Hgb A1C No results found for this basename: HGBA1C        Assessment & Plan:   Right shoulder pain s/p fall at home:  Will check xray of right shoulder to r/o underlying damage Refilled norco for pain  Hematoma of lower leg:  She reports she is a "free bleeder" Will check CBC and CMET today to r/o clotting disorder Advised her not to take Ibuprofen if she notices she bruises easily  Will follow up after labs and xray

## 2013-08-16 NOTE — Progress Notes (Signed)
Pre visit review using our clinic review tool, if applicable. No additional management support is needed unless otherwise documented below in the visit note. 

## 2013-09-06 ENCOUNTER — Other Ambulatory Visit: Payer: Self-pay | Admitting: *Deleted

## 2013-09-06 MED ORDER — GABAPENTIN 300 MG PO CAPS: 600.0000 mg | ORAL_CAPSULE | Freq: Every day | ORAL | Status: DC

## 2013-09-06 NOTE — Telephone Encounter (Signed)
Yes 90 days with 3 refills

## 2013-09-06 NOTE — Telephone Encounter (Signed)
rx sent to pharmacy by e-script  

## 2013-09-06 NOTE — Telephone Encounter (Signed)
Ok to fill 90 day supply

## 2013-09-13 ENCOUNTER — Encounter: Payer: Self-pay | Admitting: Internal Medicine

## 2013-09-13 DIAGNOSIS — W19XXXA Unspecified fall, initial encounter: Secondary | ICD-10-CM

## 2013-09-13 DIAGNOSIS — Y92009 Unspecified place in unspecified non-institutional (private) residence as the place of occurrence of the external cause: Principal | ICD-10-CM

## 2013-09-14 MED ORDER — HYDROCODONE-ACETAMINOPHEN 10-325 MG PO TABS: 1.0000 | ORAL_TABLET | Freq: Two times a day (BID) | ORAL | Status: DC | PRN

## 2013-10-18 ENCOUNTER — Encounter: Payer: Self-pay | Admitting: Radiology

## 2013-10-18 ENCOUNTER — Encounter: Payer: Self-pay | Admitting: Internal Medicine

## 2013-10-18 ENCOUNTER — Ambulatory Visit (INDEPENDENT_AMBULATORY_CARE_PROVIDER_SITE_OTHER): Payer: No Typology Code available for payment source | Admitting: Internal Medicine

## 2013-10-18 VITALS — BP 100/60 | HR 70 | Temp 97.5°F | Ht 63.0 in | Wt 142.0 lb

## 2013-10-18 DIAGNOSIS — F17200 Nicotine dependence, unspecified, uncomplicated: Secondary | ICD-10-CM

## 2013-10-18 DIAGNOSIS — M549 Dorsalgia, unspecified: Secondary | ICD-10-CM

## 2013-10-18 DIAGNOSIS — Z0000 Encounter for general adult medical examination without abnormal findings: Secondary | ICD-10-CM

## 2013-10-18 DIAGNOSIS — E785 Hyperlipidemia, unspecified: Secondary | ICD-10-CM

## 2013-10-18 MED ORDER — HYDROCODONE-ACETAMINOPHEN 10-325 MG PO TABS: 1.0000 | ORAL_TABLET | Freq: Two times a day (BID) | ORAL | Status: DC | PRN

## 2013-10-18 NOTE — Assessment & Plan Note (Signed)
Mild No primary prevention after discussion

## 2013-10-18 NOTE — Addendum Note (Signed)
Addended by: Sueanne MargaritaSMITH, Maki Hege L on: 10/18/2013 04:26 PM   Modules accepted: Orders

## 2013-10-18 NOTE — Patient Instructions (Signed)
Nicotine Addiction  Nicotine can act as both a stimulant (excites/activates) and a sedative (calms/quiets). Immediately after exposure to nicotine, there is a "kick" caused in part by the drug's stimulation of the adrenal glands and resulting discharge of adrenaline (epinephrine). The rush of adrenaline stimulates the body and causes a sudden release of sugar. This means that smokers are always slightly hyperglycemic. Hyperglycemic means that the blood sugar is high, just like in diabetics. Nicotine also decreases the amount of insulin which helps control sugar levels in the body. There is an increase in blood pressure, breathing, and the rate of heart beats.   In addition, nicotine indirectly causes a release of dopamine in the brain that controls pleasure and motivation. A similar reaction is seen with other drugs of abuse, such as cocaine and heroin. This dopamine release is thought to cause the pleasurable sensations when smoking. In some different cases, nicotine can also create a calming effect, depending on sensitivity of the smoker's nervous system and the dose of nicotine taken.  WHAT HAPPENS WHEN NICOTINE IS TAKEN FOR LONG PERIODS OF TIME?  · Long-term use of nicotine results in addiction. It is difficult to stop.  · Repeated use of nicotine creates tolerance. Higher doses of nicotine are needed to get the "kick."  When nicotine use is stopped, withdrawal may last a month or more. Withdrawal may begin within a few hours after the last cigarette. Symptoms peak within the first few days and may lessen within a few weeks. For some people, however, symptoms may last for months or longer. Withdrawal symptoms include:   · Irritability.  · Craving.  · Learning and attention deficits.  · Sleep disturbances.  · Increased appetite.  Craving for tobacco may last for 6 months or longer. Many behaviors done while using nicotine can also play a part in the severity of withdrawal symptoms. For some people, the feel,  smell, and sight of a cigarette and the ritual of obtaining, handling, lighting, and smoking the cigarette are closely linked with the pleasure of smoking. When stopped, they also miss the related behaviors which make the withdrawal or craving worse. While nicotine gum and patches may lessen the drug aspects of withdrawal, cravings often persist.  WHAT ARE THE MEDICAL CONSEQUENCES OF NICOTINE USE?  · Nicotine addiction accounts for one-third of all cancers. The top cancer caused by tobacco is lung cancer. Lung cancer is the number one cancer killer of both men and women.  · Smoking is also associated with cancers of the:  ¨ Mouth.  ¨ Pharynx.  ¨ Larynx.  ¨ Esophagus.  ¨ Stomach.  ¨ Pancreas.  ¨ Cervix.  ¨ Kidney.  ¨ Ureter.  ¨ Bladder.  · Smoking also causes lung diseases such as lasting (chronic) bronchitis and emphysema.  · It worsens asthma in adults and children.  · Smoking increases the risk of heart disease, including:  ¨ Stroke.  ¨ Heart attack.  ¨ Vascular disease.  ¨ Aneurysm.  · Passive or secondary smoke can also increase medical risks including:  ¨ Asthma in children.  ¨ Sudden Infant Death Syndrome (SIDS).  · Additionally, dropped cigarettes are the leading cause of residential fire fatalities.  · Nicotine poisoning has been reported from accidental ingestion of tobacco products by children and pets. Death usually results in a few minutes from respiratory failure (when a person stops breathing) caused by paralysis.  TREATMENT   · Medication. Nicotine replacement medicines such as nicotine gum and the patch are used to   stop smoking. These medicines gradually lower the dosage of nicotine in the body. These medicines do not contain the carbon monoxide and other toxins found in tobacco smoke.  · Hypnotherapy.  · Relaxation therapy.  · Nicotine Anonymous (a 12-step support program). Find times and locations in your local yellow pages.  Document Released: 12/03/2003 Document Revised: 06/21/2011 Document  Reviewed: 04/26/2007  ExitCare® Patient Information ©2015 [employer]. This information is not intended to replace advice given to you by your health care provider. Make sure you discuss any questions you have with your health care provider.

## 2013-10-18 NOTE — Progress Notes (Signed)
Pre visit review using our clinic review tool, if applicable. No additional management support is needed unless otherwise documented below in the visit note. 

## 2013-10-18 NOTE — Assessment & Plan Note (Signed)
Fairly healthy Discussed stopping smoking again--she has hot line number [id number] otherwise mammo just done--advised every 2 years

## 2013-10-18 NOTE — Progress Notes (Signed)
Subjective:    Patient ID: Catherine IvanMarie S Griffin, female    DOB: 1956-01-18, 58 y.o.   MRN: [id number]  HPI Here for physical Did finally recovery from injury from fall Still has some back pain at times-- uses hydrocodone bid regularly  Physically active at work in summer Some yard work Occasionally walks  Quit smoking again--for 9 months Used patch Then went back about 2 months ago  Current Outpatient Prescriptions on File Prior to Visit  Medication Sig Dispense Refill  . cholecalciferol (VITAMIN D) 1000 UNITS tablet Take 1,000 Units by mouth daily.      Marland Kitchen. gabapentin (NEURONTIN) 300 MG capsule Take 2 capsules (600 mg total) by mouth at bedtime.  180 capsule  3  . HYDROcodone-acetaminophen (NORCO) 10-325 MG per tablet Take 1 tablet by mouth 2 (two) times daily as needed.  60 tablet  0  . ibuprofen (ADVIL,MOTRIN) 200 MG tablet Take 400 mg by mouth 3 (three) times daily.       No current facility-administered medications on file prior to visit.    Allergies  Allergen Reactions  . Indomethacin     REACTION: Rash/hives/swelling    Past Medical History  Diagnosis Date  . Osteoporosis     04/25/07  . Thyroid disease     hypothyrodism  . Arthritis   . Lichen sclerosus   . Back pain     04/25/07  . Actinic keratosis     04/25/07  . Vaginitis     10/01/10  . Diverticulosis of colon     02/28/09  . Fatigue     05/30/08  . Chest pain     04/25/07  . RUQ abdominal pain     02/07/09  . Hemorrhoid     12/01/10  . Abdominal pain   . Hyperlipidemia     Past Surgical History  Procedure Laterality Date  . Abdominal hysterectomy      Family History  Problem Relation Age of Onset  . Heart disease Father   . Arthritis Father   . Other Father     joint replacement  . Coronary artery disease Neg Hx   . Diabetes Neg Hx   . Cancer Mother     lung  . Anemia Sister   . Other Sister     CHF    History   Social History  . Marital Status: Married    Spouse Name: N/A   Number of Children: N/A  . Years of Education: N/A   Occupational History  . runs pool service for 7 locations    Social History Main Topics  . Smoking status: Current Every Day Smoker    Types: Cigarettes  . Smokeless tobacco: Never Used  . Alcohol Use: No  . Drug Use: No  . Sexual Activity: Not on file   Other Topics Concern  . Not on file   Social History Narrative   Daily caffeine use- 2 drinks daily   Patient does not get regular exercise   Review of Systems  Constitutional: Negative for fatigue and unexpected weight change.       Wears seat belt  HENT: Negative for dental problem, hearing loss and tinnitus.        Regular with dentist  Eyes: Negative for visual disturbance.       No diplopia or unilateral loss  Respiratory: Negative for cough, chest tightness and shortness of breath.   Cardiovascular: Positive for leg swelling. Negative for chest pain and palpitations.  Some swelling if prolonged sitting  Endocrine: Negative for cold intolerance and heat intolerance.  Genitourinary: Negative for dysuria, hematuria, difficulty urinating and dyspareunia.  Musculoskeletal: Positive for back pain. Negative for arthralgias and joint swelling.  Skin: Negative for rash.       No suspicious lesions  Allergic/Immunologic: Negative for environmental allergies and immunocompromised state.  Neurological: Negative for dizziness, syncope, weakness, light-headedness, numbness and headaches.  Hematological: Negative for adenopathy. Bruises/bleeds easily.  Psychiatric/Behavioral: Negative for sleep disturbance and dysphoric mood. The patient is not nervous/anxious.        Objective:   Physical Exam  Constitutional: She is oriented to person, place, and time. She appears well-developed and well-nourished. No distress.  HENT:  Head: Normocephalic and atraumatic.  Right Ear: External ear normal.  Left Ear: External ear normal.  Mouth/Throat: Oropharynx is clear and moist. No  oropharyngeal exudate.  Eyes: Conjunctivae and EOM are normal. Pupils are equal, round, and reactive to light.  Neck: Normal range of motion. Neck supple. No thyromegaly present.  Cardiovascular: Normal rate, regular rhythm, normal heart sounds and intact distal pulses.  Exam reveals no gallop.   No murmur heard. Pulmonary/Chest: Effort normal and breath sounds normal. No respiratory distress. She has no wheezes. She has no rales.  Abdominal: Soft. There is no tenderness.  Genitourinary:  Periareolar cystic changes in bilateral breasts  Musculoskeletal: She exhibits no edema and no tenderness.  Lymphadenopathy:    She has no cervical adenopathy.    She has no axillary adenopathy.  Neurological: She is alert and oriented to person, place, and time.  Skin: No rash noted. No erythema.  Psychiatric: She has a normal mood and affect. Her behavior is normal.          Assessment & Plan:

## 2013-10-18 NOTE — Assessment & Plan Note (Signed)
Counseled on cigarette cessation She used patches and has the hot line number

## 2013-10-18 NOTE — Assessment & Plan Note (Signed)
Ongoing back pain  Does okay with bid hydrocodone

## 2013-11-01 ENCOUNTER — Encounter: Payer: Self-pay | Admitting: Internal Medicine

## 2013-11-14 ENCOUNTER — Encounter: Payer: Self-pay | Admitting: Internal Medicine

## 2013-11-14 DIAGNOSIS — M549 Dorsalgia, unspecified: Secondary | ICD-10-CM

## 2013-11-15 MED ORDER — HYDROCODONE-ACETAMINOPHEN 10-325 MG PO TABS: 1.0000 | ORAL_TABLET | Freq: Two times a day (BID) | ORAL | Status: DC | PRN

## 2013-11-15 NOTE — Telephone Encounter (Signed)
Husband will pick up tomorrow

## 2013-12-12 ENCOUNTER — Encounter: Payer: Self-pay | Admitting: Internal Medicine

## 2013-12-12 DIAGNOSIS — M549 Dorsalgia, unspecified: Secondary | ICD-10-CM

## 2013-12-13 MED ORDER — HYDROCODONE-ACETAMINOPHEN 10-325 MG PO TABS: 1.0000 | ORAL_TABLET | Freq: Two times a day (BID) | ORAL | Status: DC | PRN

## 2014-01-13 ENCOUNTER — Encounter: Payer: Self-pay | Admitting: Internal Medicine

## 2014-01-14 ENCOUNTER — Encounter: Payer: Self-pay | Admitting: Internal Medicine

## 2014-01-14 DIAGNOSIS — G8929 Other chronic pain: Secondary | ICD-10-CM

## 2014-01-14 DIAGNOSIS — M549 Dorsalgia, unspecified: Principal | ICD-10-CM

## 2014-01-15 MED ORDER — HYDROCODONE-ACETAMINOPHEN 10-325 MG PO TABS: 1.0000 | ORAL_TABLET | Freq: Two times a day (BID) | ORAL | Status: DC | PRN

## 2014-01-15 NOTE — Telephone Encounter (Signed)
Please let her know Rx ready

## 2014-02-12 ENCOUNTER — Encounter: Payer: Self-pay | Admitting: Internal Medicine

## 2014-02-12 DIAGNOSIS — M549 Dorsalgia, unspecified: Principal | ICD-10-CM

## 2014-02-12 DIAGNOSIS — G8929 Other chronic pain: Secondary | ICD-10-CM

## 2014-02-12 MED ORDER — HYDROCODONE-ACETAMINOPHEN 10-325 MG PO TABS: 1.0000 | ORAL_TABLET | Freq: Two times a day (BID) | ORAL | Status: DC | PRN

## 2014-02-12 NOTE — Telephone Encounter (Signed)
01/15/14 

## 2014-02-12 NOTE — Telephone Encounter (Signed)
Sent patient message back thru my-chart, that prescription is ready for pick-up and will be at the front desk.  

## 2014-02-12 NOTE — Telephone Encounter (Signed)
Okay a couple of days early Should not be due till 12/5 or so

## 2014-03-13 ENCOUNTER — Encounter: Payer: Self-pay | Admitting: Internal Medicine

## 2014-03-13 DIAGNOSIS — G8929 Other chronic pain: Secondary | ICD-10-CM

## 2014-03-13 DIAGNOSIS — M549 Dorsalgia, unspecified: Principal | ICD-10-CM

## 2014-03-13 MED ORDER — HYDROCODONE-ACETAMINOPHEN 10-325 MG PO TABS: 1.0000 | ORAL_TABLET | Freq: Two times a day (BID) | ORAL | Status: DC | PRN

## 2014-03-14 ENCOUNTER — Encounter: Payer: Self-pay | Admitting: Internal Medicine

## 2014-04-08 ENCOUNTER — Encounter: Payer: Self-pay | Admitting: Internal Medicine

## 2014-04-08 DIAGNOSIS — M549 Dorsalgia, unspecified: Principal | ICD-10-CM

## 2014-04-08 DIAGNOSIS — G8929 Other chronic pain: Secondary | ICD-10-CM

## 2014-04-08 NOTE — Telephone Encounter (Signed)
03/13/14  

## 2014-04-09 MED ORDER — HYDROCODONE-ACETAMINOPHEN 10-325 MG PO TABS: 1.0000 | ORAL_TABLET | Freq: Two times a day (BID) | ORAL | Status: DC | PRN

## 2014-04-20 ENCOUNTER — Ambulatory Visit: Payer: Self-pay

## 2014-04-20 LAB — RAPID INFLUENZA A&B ANTIGENS

## 2014-05-06 ENCOUNTER — Encounter: Payer: Self-pay | Admitting: Internal Medicine

## 2014-05-06 MED ORDER — FLUCONAZOLE 150 MG PO TABS: 150.0000 mg | ORAL_TABLET | Freq: Once | ORAL | Status: DC

## 2014-05-13 ENCOUNTER — Encounter: Payer: Self-pay | Admitting: Internal Medicine

## 2014-05-13 ENCOUNTER — Telehealth: Payer: Self-pay

## 2014-05-13 DIAGNOSIS — G8929 Other chronic pain: Secondary | ICD-10-CM

## 2014-05-13 DIAGNOSIS — M549 Dorsalgia, unspecified: Principal | ICD-10-CM

## 2014-05-13 MED ORDER — HYDROCODONE-ACETAMINOPHEN 10-325 MG PO TABS: 1.0000 | ORAL_TABLET | Freq: Two times a day (BID) | ORAL | Status: DC | PRN

## 2014-05-13 NOTE — Telephone Encounter (Signed)
Called patient to notify her that Rx is ready for pick-up. Patient stated she will pick it up tomorrow.

## 2014-05-17 ENCOUNTER — Encounter: Payer: Self-pay | Admitting: Internal Medicine

## 2014-05-17 MED ORDER — FIRST-DUKES MOUTHWASH MT SUSP: 5.0000 mL | OROMUCOSAL | Status: DC

## 2014-06-05 ENCOUNTER — Ambulatory Visit (INDEPENDENT_AMBULATORY_CARE_PROVIDER_SITE_OTHER): Payer: No Typology Code available for payment source | Admitting: Internal Medicine

## 2014-06-05 ENCOUNTER — Encounter: Payer: Self-pay | Admitting: Internal Medicine

## 2014-06-05 VITALS — BP 106/68 | HR 88 | Temp 98.0°F | Wt 145.8 lb

## 2014-06-05 DIAGNOSIS — R1011 Right upper quadrant pain: Secondary | ICD-10-CM

## 2014-06-05 NOTE — Progress Notes (Signed)
Subjective:    Patient ID: Catherine Griffin, female    DOB: 05/03/1955, 59 y.o.   MRN: [id number]  HPI Here due to "rough pain in stomach" On right side and up under her ribs Mild symptoms over the years--but now much more severe Noticed in past with exertion--or even just lying around  Now having daily pain Squeezing sensation in RUQ Day and night--affecting her sleep Does seem to be worse after supper and at night ?related to stress since her dad died and family medical problems  No nausea or vomiting Bowels are okay  Current Outpatient Prescriptions on File Prior to Visit  Medication Sig Dispense Refill  . cholecalciferol (VITAMIN D) 1000 UNITS tablet Take 1,000 Units by mouth daily.    Marland Kitchen. gabapentin (NEURONTIN) 300 MG capsule Take 2 capsules (600 mg total) by mouth at bedtime. 180 capsule 3  . HYDROcodone-acetaminophen (NORCO) 10-325 MG per tablet Take 1 tablet by mouth 2 (two) times daily as needed. 60 tablet 0  . ibuprofen (ADVIL,MOTRIN) 200 MG tablet Take 400 mg by mouth 3 (three) times daily.     No current facility-administered medications on file prior to visit.    Allergies  Allergen Reactions  . Indomethacin     REACTION: Rash/hives/swelling    Past Medical History  Diagnosis Date  . Osteoporosis     04/25/07  . Thyroid disease     hypothyrodism  . Arthritis   . Lichen sclerosus   . Back pain     04/25/07  . Actinic keratosis     04/25/07  . Vaginitis     10/01/10  . Diverticulosis of colon     02/28/09  . Fatigue     05/30/08  . Chest pain     04/25/07  . RUQ abdominal pain     02/07/09  . Hemorrhoid     12/01/10  . Abdominal pain   . Hyperlipidemia     Past Surgical History  Procedure Laterality Date  . Abdominal hysterectomy      Family History  Problem Relation Age of Onset  . Heart disease Father   . Arthritis Father   . Other Father     joint replacement  . Coronary artery disease Neg Hx   . Diabetes Neg Hx   . Cancer Mother      lung  . Anemia Sister   . Other Sister     CHF    History   Social History  . Marital Status: Married    Spouse Name: N/A  . Number of Children: N/A  . Years of Education: N/A   Occupational History  . runs pool service for 7 locations    Social History Main Topics  . Smoking status: Current Every Day Smoker    Types: Cigarettes  . Smokeless tobacco: Never Used  . Alcohol Use: No  . Drug Use: No  . Sexual Activity: Not on file   Other Topics Concern  . Not on file   Social History Narrative   2 step children   Review of Systems  Chronic smoker's cough--was trying patch but caused mouth problems No fever except for cold --which is now better No SOB No heartburn No falls or injury     Objective:   Physical Exam  Constitutional: She appears well-developed and well-nourished. No distress.  Neck: Normal range of motion. Neck supple. No thyromegaly present.  Cardiovascular: Normal rate, regular rhythm and normal heart sounds.  Exam reveals no gallop.  No murmur heard. Pulmonary/Chest: Effort normal and breath sounds normal. No respiratory distress. She has no wheezes. She has no rales. She exhibits no tenderness.  No dullness No rib tenderness   Abdominal: Soft. Bowel sounds are normal. She exhibits no distension and no mass. There is no rebound and no guarding.  Very slightly positive Murphy's sign but no tenderness otherwise  Lymphadenopathy:    She has no cervical adenopathy.          Assessment & Plan:

## 2014-06-05 NOTE — Assessment & Plan Note (Signed)
Not classic presentation for gallbladder--but still probably mild inflammation as the cause of symptoms No trauma or evidence of bony or soft tissue involvement No effusion or lung findings to suggest pulmonary source Reflux is possible but less likely  Will check RUQ ultrasound If positive, referral for Sankar/Byrnett If negative, 2 week trial if PPI and consider CXR

## 2014-06-05 NOTE — Progress Notes (Signed)
Pre visit review using our clinic review tool, if applicable. No additional management support is needed unless otherwise documented below in the visit note. 

## 2014-06-06 ENCOUNTER — Telehealth: Payer: Self-pay | Admitting: Internal Medicine

## 2014-06-06 NOTE — Telephone Encounter (Signed)
emmi emailed °

## 2014-06-07 ENCOUNTER — Encounter: Payer: Self-pay | Admitting: Internal Medicine

## 2014-06-07 ENCOUNTER — Ambulatory Visit: Payer: Self-pay | Admitting: Internal Medicine

## 2014-06-11 ENCOUNTER — Encounter: Payer: Self-pay | Admitting: Internal Medicine

## 2014-06-11 DIAGNOSIS — M549 Dorsalgia, unspecified: Principal | ICD-10-CM

## 2014-06-11 DIAGNOSIS — G8929 Other chronic pain: Secondary | ICD-10-CM

## 2014-06-11 MED ORDER — HYDROCODONE-ACETAMINOPHEN 10-325 MG PO TABS: 1.0000 | ORAL_TABLET | Freq: Two times a day (BID) | ORAL | Status: DC | PRN

## 2014-06-11 NOTE — Telephone Encounter (Signed)
Please let her know the Rx is ready for her

## 2014-06-11 NOTE — Telephone Encounter (Signed)
Hydrocodone refill request.  Patient last seen 06/05/2014.  Last filled 05/13/2014.

## 2014-07-02 ENCOUNTER — Ambulatory Visit: Payer: Self-pay | Admitting: Internal Medicine

## 2014-07-04 ENCOUNTER — Ambulatory Visit: Payer: Self-pay | Admitting: Family Medicine

## 2014-07-04 ENCOUNTER — Encounter: Payer: Self-pay | Admitting: Internal Medicine

## 2014-07-08 ENCOUNTER — Encounter: Payer: Self-pay | Admitting: Internal Medicine

## 2014-07-08 NOTE — Telephone Encounter (Signed)
I'm sorry we can not email them and they must be signed in the office

## 2014-07-13 ENCOUNTER — Encounter: Payer: Self-pay | Admitting: Internal Medicine

## 2014-07-19 ENCOUNTER — Encounter: Payer: Self-pay | Admitting: Internal Medicine

## 2014-07-19 DIAGNOSIS — M549 Dorsalgia, unspecified: Principal | ICD-10-CM

## 2014-07-19 DIAGNOSIS — G8929 Other chronic pain: Secondary | ICD-10-CM

## 2014-07-19 NOTE — Telephone Encounter (Signed)
Last f/u appt 10/2013-CPE

## 2014-07-20 NOTE — Telephone Encounter (Signed)
Approved: okay to prepare Rx for #60 x 0

## 2014-07-22 MED ORDER — HYDROCODONE-ACETAMINOPHEN 10-325 MG PO TABS: 1.0000 | ORAL_TABLET | Freq: Two times a day (BID) | ORAL | Status: DC | PRN

## 2014-07-22 NOTE — Telephone Encounter (Signed)
Sent patient message back thru my-chart, that prescription is ready for pick-up and will be at the front desk.  

## 2014-07-30 ENCOUNTER — Ambulatory Visit: Payer: No Typology Code available for payment source | Admitting: Internal Medicine

## 2014-08-21 ENCOUNTER — Encounter: Payer: Self-pay | Admitting: Internal Medicine

## 2014-08-21 DIAGNOSIS — G8929 Other chronic pain: Secondary | ICD-10-CM

## 2014-08-21 DIAGNOSIS — M549 Dorsalgia, unspecified: Principal | ICD-10-CM

## 2014-08-21 NOTE — Telephone Encounter (Signed)
07/22/2014 and pt states she decided to keep you as her PCP, [facility] She does not have any future appts available.

## 2014-08-22 MED ORDER — HYDROCODONE-ACETAMINOPHEN 10-325 MG PO TABS: 1.0000 | ORAL_TABLET | Freq: Two times a day (BID) | ORAL | Status: DC | PRN

## 2014-08-22 NOTE — Telephone Encounter (Signed)
Sent patient message back thru my-chart, advised to call or resend message if pt has any questions. Sent patient message back thru my-chart, that prescription is ready for pick-up and will be at the front desk.

## 2014-08-22 NOTE — Telephone Encounter (Signed)
Have her set up appt within the next 4-6 months Physical if she has insurance, just follow up otherwise

## 2014-09-22 ENCOUNTER — Encounter: Payer: Self-pay | Admitting: Internal Medicine

## 2014-09-22 DIAGNOSIS — G8929 Other chronic pain: Secondary | ICD-10-CM

## 2014-09-22 DIAGNOSIS — M549 Dorsalgia, unspecified: Principal | ICD-10-CM

## 2014-09-23 MED ORDER — HYDROCODONE-ACETAMINOPHEN 10-325 MG PO TABS: 1.0000 | ORAL_TABLET | Freq: Two times a day (BID) | ORAL | Status: DC | PRN

## 2014-09-23 NOTE — Telephone Encounter (Signed)
08/22/2014 

## 2014-09-23 NOTE — Telephone Encounter (Signed)
Approved: okay to prepare Rx #60 x 0

## 2014-10-24 ENCOUNTER — Ambulatory Visit (INDEPENDENT_AMBULATORY_CARE_PROVIDER_SITE_OTHER): Payer: BLUE CROSS/BLUE SHIELD | Admitting: Internal Medicine

## 2014-10-24 ENCOUNTER — Encounter: Payer: Self-pay | Admitting: Internal Medicine

## 2014-10-24 VITALS — BP 106/64 | HR 72 | Temp 98.0°F | Wt 143.0 lb

## 2014-10-24 DIAGNOSIS — G8929 Other chronic pain: Secondary | ICD-10-CM | POA: Diagnosis not present

## 2014-10-24 DIAGNOSIS — M549 Dorsalgia, unspecified: Secondary | ICD-10-CM | POA: Diagnosis not present

## 2014-10-24 DIAGNOSIS — E039 Hypothyroidism, unspecified: Secondary | ICD-10-CM | POA: Diagnosis not present

## 2014-10-24 DIAGNOSIS — E785 Hyperlipidemia, unspecified: Secondary | ICD-10-CM

## 2014-10-24 DIAGNOSIS — F17200 Nicotine dependence, unspecified, uncomplicated: Secondary | ICD-10-CM

## 2014-10-24 DIAGNOSIS — Z720 Tobacco use: Secondary | ICD-10-CM | POA: Diagnosis not present

## 2014-10-24 MED ORDER — HYDROCODONE-ACETAMINOPHEN 10-325 MG PO TABS: 1.0000 | ORAL_TABLET | Freq: Two times a day (BID) | ORAL | Status: DC | PRN

## 2014-10-24 MED ORDER — CYCLOBENZAPRINE HCL 10 MG PO TABS: 5.0000 mg | ORAL_TABLET | Freq: Every evening | ORAL | Status: DC | PRN

## 2014-10-24 NOTE — Assessment & Plan Note (Signed)
Not going to use primary prevention but will recheck

## 2014-10-24 NOTE — Assessment & Plan Note (Signed)
Continue the hydrocodone Cyclobenzaprine for bed instead of gabapentin

## 2014-10-24 NOTE — Assessment & Plan Note (Signed)
Seems euthyroid without meds Will recheck labs

## 2014-10-24 NOTE — Assessment & Plan Note (Signed)
Not ready to stop yet counseled

## 2014-10-24 NOTE — Progress Notes (Signed)
Subjective:    Patient ID: Catherine Griffin, female    DOB: [birth year], 59 y.o.   MRN: [id number]  HPI Here for follow up of chronic medical conditions  Still has the abdominal pain Went to another doctor Told she has hepatitis--- but serologic tests negative (reviewed) Doesn't feel sick anymore The right flank pain persists at RUQ Comes and goes--- back again over the past 3 weeks Intermittent May be muscular  Still smoking Would like to stop but not really ready yet Clears her throat a lot--but no real cough No breathing problems or wheezing  Still uses the hydrocodone for her back Given flexeril from the other doctor--- ran out  Helped better than gabapentin (taking just at bedtime)  Current Outpatient Prescriptions on File Prior to Visit  Medication Sig Dispense Refill  . cholecalciferol (VITAMIN D) 1000 UNITS tablet Take 1,000 Units by mouth daily.    . Diphenhyd-Hydrocort-Nystatin (FIRST-DUKES MOUTHWASH) SUSP Use as directed in the mouth or throat.    . gabapentin (NEURONTIN) 300 MG capsule Take 2 capsules (600 mg total) by mouth at bedtime. 180 capsule 3  . HYDROcodone-acetaminophen (NORCO) 10-325 MG per tablet Take 1 tablet by mouth 2 (two) times daily as needed. 60 tablet 0  . ibuprofen (ADVIL,MOTRIN) 200 MG tablet Take 400 mg by mouth 3 (three) times daily.     No current facility-administered medications on file prior to visit.    Allergies  Allergen Reactions  . Codeine Phosphate [Codeine] Nausea Only  . Indomethacin     REACTION: Rash/hives/swelling    Past Medical History  Diagnosis Date  . Osteoporosis     04/25/07  . Thyroid disease     hypothyrodism  . Arthritis   . Lichen sclerosus   . Back pain     04/25/07  . Actinic keratosis     04/25/07  . Vaginitis     10/01/10  . Diverticulosis of colon     02/28/09  . Fatigue     05/30/08  . Chest pain     04/25/07  . RUQ abdominal pain     02/07/09  . Hemorrhoid     12/01/10  . Abdominal  pain   . Hyperlipidemia   . Hypothyroidism   . Abdominal pain, right upper quadrant   . Aphthous ulcer of mouth   . Hepatitis C   . Osteopenia     Past Surgical History  Procedure Laterality Date  . Abdominal hysterectomy      Family History  Problem Relation Age of Onset  . Heart disease Father   . Arthritis Father   . Other Father     joint replacement  . Coronary artery disease Neg Hx   . Diabetes Neg Hx   . Cancer Mother     lung  . Anemia Sister   . Other Sister     CHF    History   Social History  . Marital Status: Married    Spouse Name: N/A  . Number of Children: N/A  . Years of Education: N/A   Occupational History  . runs pool service for 7 locations    Social History Main Topics  . Smoking status: Current Every Day Smoker    Types: Cigarettes  . Smokeless tobacco: Never Used  . Alcohol Use: No  . Drug Use: No  . Sexual Activity: Not on file   Other Topics Concern  . Not on file   Social History Narrative   2 step  children   Review of Systems Appetite is okay Weight stable Sleeps fair with help for back    Objective:   Physical Exam  Constitutional: She appears well-developed and well-nourished. No distress.  Neck: Normal range of motion. Neck supple. No thyromegaly present.  Cardiovascular: Normal rate, regular rhythm and normal heart sounds.  Exam reveals no gallop.   No murmur heard. Pulmonary/Chest: Effort normal and breath sounds normal. No respiratory distress. She has no wheezes. She has no rales.  Musculoskeletal: She exhibits no edema.  Lymphadenopathy:    She has no cervical adenopathy.  Psychiatric: She has a normal mood and affect. Her behavior is normal.          Assessment & Plan:

## 2014-10-24 NOTE — Progress Notes (Signed)
Pre visit review using our clinic review tool, if applicable. No additional management support is needed unless otherwise documented below in the visit note. 

## 2014-10-25 LAB — COMPREHENSIVE METABOLIC PANEL
ALBUMIN: 3.9 g/dL (ref 3.5–5.2)
ALT: 13 U/L (ref 0–35)
AST: 23 U/L (ref 0–37)
Alkaline Phosphatase: 66 U/L (ref 39–117)
BILIRUBIN TOTAL: 0.1 mg/dL — AB (ref 0.2–1.2)
BUN: 19 mg/dL (ref 6–23)
CALCIUM: 9.3 mg/dL (ref 8.4–10.5)
CO2: 26 meq/L (ref 19–32)
Chloride: 104 mEq/L (ref 96–112)
Creatinine, Ser: 0.87 mg/dL (ref 0.40–1.20)
GFR: 70.3 mL/min (ref >60.00)
Glucose, Bld: 88 mg/dL (ref 70–99)
Potassium: 4.2 mEq/L (ref 3.5–5.1)
Sodium: 139 mEq/L (ref 135–145)
TOTAL PROTEIN: 7.1 g/dL (ref 6.0–8.3)

## 2014-10-25 LAB — LIPID PANEL
CHOLESTEROL: 228 mg/dL — AB (ref 0–200)
HDL: 50.6 mg/dL (ref >39.00)
LDL CALC: 150 mg/dL — AB (ref 0–99)
NonHDL: 177.4
TRIGLYCERIDES: 137 mg/dL (ref 0.0–149.0)
Total CHOL/HDL Ratio: 5
VLDL: 27.4 mg/dL (ref 0.0–40.0)

## 2014-10-25 LAB — T4, FREE: FREE T4: 0.7 ng/dL (ref 0.60–1.60)

## 2014-10-25 LAB — CBC WITH DIFFERENTIAL/PLATELET
BASOS PCT: 0.5 % (ref 0.0–3.0)
Basophils Absolute: 0 10*3/uL (ref 0.0–0.1)
EOS PCT: 3 % (ref 0.0–5.0)
Eosinophils Absolute: 0.3 10*3/uL (ref 0.0–0.7)
HCT: 42.6 % (ref 36.0–46.0)
HEMOGLOBIN: 14.4 g/dL (ref 12.0–15.0)
Lymphocytes Relative: 32.6 % (ref 12.0–46.0)
Lymphs Abs: 2.9 10*3/uL (ref 0.7–4.0)
MCHC: 33.8 g/dL (ref 30.0–36.0)
MCV: 93.2 fl (ref 78.0–100.0)
MONOS PCT: 1.6 % — AB (ref 3.0–12.0)
Monocytes Absolute: 0.1 10*3/uL (ref 0.1–1.0)
NEUTROS ABS: 5.6 10*3/uL (ref 1.4–7.7)
Neutrophils Relative %: 62.3 % (ref 43.0–77.0)
Platelets: 218 10*3/uL (ref 150.0–400.0)
RBC: 4.57 Mil/uL (ref 3.87–5.11)
RDW: 14.1 % (ref 11.5–15.5)
WBC: 9 10*3/uL (ref 4.0–10.5)

## 2014-10-25 LAB — TSH: TSH: 1.37 u[IU]/mL (ref 0.35–4.50)

## 2014-11-24 ENCOUNTER — Encounter: Payer: Self-pay | Admitting: Internal Medicine

## 2014-11-24 DIAGNOSIS — M549 Dorsalgia, unspecified: Principal | ICD-10-CM

## 2014-11-24 DIAGNOSIS — G8929 Other chronic pain: Secondary | ICD-10-CM

## 2014-11-25 MED ORDER — HYDROCODONE-ACETAMINOPHEN 10-325 MG PO TABS: 1.0000 | ORAL_TABLET | Freq: Two times a day (BID) | ORAL | Status: DC | PRN

## 2014-11-25 NOTE — Telephone Encounter (Signed)
Sent patient message back thru my-chart, that prescription is ready for pick-up and will be at the front desk.  

## 2014-11-25 NOTE — Telephone Encounter (Signed)
10/24/2014 

## 2014-12-25 ENCOUNTER — Encounter: Payer: Self-pay | Admitting: Internal Medicine

## 2014-12-25 DIAGNOSIS — G8929 Other chronic pain: Secondary | ICD-10-CM

## 2014-12-25 DIAGNOSIS — M549 Dorsalgia, unspecified: Principal | ICD-10-CM

## 2014-12-25 MED ORDER — HYDROCODONE-ACETAMINOPHEN 10-325 MG PO TABS: 1.0000 | ORAL_TABLET | Freq: Two times a day (BID) | ORAL | Status: DC | PRN

## 2014-12-25 NOTE — Telephone Encounter (Signed)
11/25/2014 

## 2014-12-25 NOTE — Telephone Encounter (Signed)
Sent patient message back thru my-chart, that prescription is ready for pick-up and will be at the front desk.  

## 2015-01-23 ENCOUNTER — Encounter: Payer: Self-pay | Admitting: Internal Medicine

## 2015-01-23 DIAGNOSIS — G8929 Other chronic pain: Secondary | ICD-10-CM

## 2015-01-23 DIAGNOSIS — M549 Dorsalgia, unspecified: Principal | ICD-10-CM

## 2015-01-23 MED ORDER — HYDROCODONE-ACETAMINOPHEN 10-325 MG PO TABS: 1.0000 | ORAL_TABLET | Freq: Two times a day (BID) | ORAL | Status: DC | PRN

## 2015-01-23 NOTE — Telephone Encounter (Signed)
12/25/14 

## 2015-02-11 ENCOUNTER — Other Ambulatory Visit: Payer: Self-pay | Admitting: Internal Medicine

## 2015-02-24 ENCOUNTER — Ambulatory Visit (INDEPENDENT_AMBULATORY_CARE_PROVIDER_SITE_OTHER): Payer: BLUE CROSS/BLUE SHIELD | Admitting: Internal Medicine

## 2015-02-24 ENCOUNTER — Encounter: Payer: Self-pay | Admitting: Internal Medicine

## 2015-02-24 ENCOUNTER — Ambulatory Visit (INDEPENDENT_AMBULATORY_CARE_PROVIDER_SITE_OTHER)
Admission: RE | Admit: 2015-02-24 | Discharge: 2015-02-24 | Disposition: A | Discharge status: Home / Self Care | Payer: BLUE CROSS/BLUE SHIELD | Source: Ambulatory Visit | Attending: Internal Medicine | Admitting: Internal Medicine

## 2015-02-24 VITALS — BP 118/70 | HR 88 | Temp 98.2°F | Wt 149.0 lb

## 2015-02-24 DIAGNOSIS — G8929 Other chronic pain: Secondary | ICD-10-CM | POA: Diagnosis not present

## 2015-02-24 DIAGNOSIS — M25551 Pain in right hip: Secondary | ICD-10-CM

## 2015-02-24 DIAGNOSIS — M549 Dorsalgia, unspecified: Secondary | ICD-10-CM | POA: Diagnosis not present

## 2015-02-24 LAB — CBC WITH DIFFERENTIAL/PLATELET
BASOS ABS: 0 10*3/uL (ref 0.0–0.1)
Basophils Relative: 0.4 % (ref 0.0–3.0)
Eosinophils Absolute: 0.2 10*3/uL (ref 0.0–0.7)
Eosinophils Relative: 2.1 % (ref 0.0–5.0)
HCT: 43.9 % (ref 36.0–46.0)
Hemoglobin: 14.7 g/dL (ref 12.0–15.0)
LYMPHS ABS: 2.6 10*3/uL (ref 0.7–4.0)
Lymphocytes Relative: 28.2 % (ref 12.0–46.0)
MCHC: 33.5 g/dL (ref 30.0–36.0)
MCV: 93.1 fl (ref 78.0–100.0)
MONO ABS: 0.7 10*3/uL (ref 0.1–1.0)
MONOS PCT: 7.4 % (ref 3.0–12.0)
NEUTROS ABS: 5.6 10*3/uL (ref 1.4–7.7)
NEUTROS PCT: 61.9 % (ref 43.0–77.0)
PLATELETS: 223 10*3/uL (ref 150.0–400.0)
RBC: 4.72 Mil/uL (ref 3.87–5.11)
RDW: 14.6 % (ref 11.5–15.5)
WBC: 9.1 10*3/uL (ref 4.0–10.5)

## 2015-02-24 LAB — SEDIMENTATION RATE: Sed Rate: 24 mm/hr — ABNORMAL HIGH (ref 0–22)

## 2015-02-24 LAB — COMPREHENSIVE METABOLIC PANEL
ALK PHOS: 69 U/L (ref 39–117)
ALT: 10 U/L (ref 0–35)
AST: 21 U/L (ref 0–37)
Albumin: 4.1 g/dL (ref 3.5–5.2)
BILIRUBIN TOTAL: 0.2 mg/dL (ref 0.2–1.2)
BUN: 15 mg/dL (ref 6–23)
CO2: 29 meq/L (ref 19–32)
CREATININE: 0.77 mg/dL (ref 0.40–1.20)
Calcium: 9.5 mg/dL (ref 8.4–10.5)
Chloride: 105 mEq/L (ref 96–112)
GFR: 80.85 mL/min (ref >60.00)
GLUCOSE: 103 mg/dL — AB (ref 70–99)
Potassium: 4 mEq/L (ref 3.5–5.1)
SODIUM: 141 meq/L (ref 135–145)
TOTAL PROTEIN: 7.3 g/dL (ref 6.0–8.3)

## 2015-02-24 MED ORDER — HYDROCODONE-ACETAMINOPHEN 10-325 MG PO TABS: 1.0000 | ORAL_TABLET | Freq: Two times a day (BID) | ORAL | Status: DC | PRN

## 2015-02-24 NOTE — Progress Notes (Signed)
Subjective:    Patient ID: [name], female    DOB: Sep 15, 1955, 59 y.o.   MRN: [id number]  HPI Here due to pain at the top of her hip bone Moves towards groin Really bad in the past 2 weeks---despite no new tasks or injury May come on at rest-- eases some with walking  Still has the abdominal pain Thinks it may be related  Hydrocodone helps her back still--but not this new pain  Current Outpatient Prescriptions on File Prior to Visit  Medication Sig Dispense Refill  . cholecalciferol (VITAMIN D) 1000 UNITS tablet Take 1,000 Units by mouth daily.    . CYANOCOBALAMIN PO Take 1 tablet by mouth daily.    . cyclobenzaprine (FLEXERIL) 10 MG tablet TAKE 1/2 TO 1 TABLETS (5-10 MG TOTAL) BY MOUTH AT BEDTIME AS NEEDED FOR MUSCLE SPASMS. 30 tablet 0  . ECHINACEA HERB PO Take 1 tablet by mouth daily.    Marland Kitchen HYDROcodone-acetaminophen (NORCO) 10-325 MG tablet Take 1 tablet by mouth 2 (two) times daily as needed. 60 tablet 0  . ibuprofen (ADVIL,MOTRIN) 200 MG tablet Take 400 mg by mouth 3 (three) times daily.     No current facility-administered medications on file prior to visit.    Allergies  Allergen Reactions  . Codeine Phosphate [Codeine] Nausea Only  . Indomethacin     REACTION: Rash/hives/swelling    Past Medical History  Diagnosis Date  . Osteoporosis     04/25/07  . Thyroid disease     hypothyrodism  . Arthritis   . Lichen sclerosus   . Back pain     04/25/07  . Actinic keratosis     04/25/07  . Vaginitis     10/01/10  . Diverticulosis of colon     02/28/09  . Fatigue     05/30/08  . Chest pain     04/25/07  . RUQ abdominal pain     02/07/09  . Hemorrhoid     12/01/10  . Abdominal pain   . Hyperlipidemia   . Hypothyroidism   . Abdominal pain, right upper quadrant   . Aphthous ulcer of mouth   . Hepatitis C   . Osteopenia     Past Surgical History  Procedure Laterality Date  . Abdominal hysterectomy      Family History  Problem Relation Age of  Onset  . Heart disease Father   . Arthritis Father   . Other Father     joint replacement  . Coronary artery disease Neg Hx   . Diabetes Neg Hx   . Cancer Mother     lung  . Anemia Sister   . Other Sister     CHF    Social History   Social History  . Marital Status: Married    Spouse Name: N/A  . Number of Children: N/A  . Years of Education: N/A   Occupational History  . runs pool service for 7 locations    Social History Main Topics  . Smoking status: Current Every Day Smoker    Types: Cigarettes  . Smokeless tobacco: Never Used  . Alcohol Use: No  . Drug Use: No  . Sexual Activity: Not on file   Other Topics Concern  . Not on file   Social History Narrative   2 step children   Review of Systems Bowels are slow--or diarrhea at times Occasional mild nausea--but appetite is fine    Objective:   Physical Exam  Abdominal: Soft. There  is no tenderness.  Musculoskeletal:  No spine tenderness Tender at lateral tip of iliac crest--right Normal ROM of right hip          Assessment & Plan:

## 2015-02-24 NOTE — Assessment & Plan Note (Signed)
Thinks this may be related to her past abdominal pain--but vague Normal ROM makes sig hip arthritis unlikely--though can't be sure there isn't another lesion Benign colonoscopy in 2009--not sure symptoms could be from GI source though Will check hip x-ray If not better and no answer, would check abdominal CT

## 2015-02-24 NOTE — Addendum Note (Signed)
Addended by: Sueanne MargaritaSMITH, Liliann File L on: 02/24/2015 11:38 AM   Modules accepted: Orders

## 2015-02-24 NOTE — Progress Notes (Signed)
Pre visit review using our clinic review tool, if applicable. No additional management support is needed unless otherwise documented below in the visit note. 

## 2015-03-08 ENCOUNTER — Other Ambulatory Visit: Payer: Self-pay | Admitting: Internal Medicine

## 2015-03-13 ENCOUNTER — Other Ambulatory Visit: Payer: Self-pay | Admitting: Internal Medicine

## 2015-03-21 ENCOUNTER — Encounter: Payer: Self-pay | Admitting: Internal Medicine

## 2015-03-21 DIAGNOSIS — R1084 Generalized abdominal pain: Secondary | ICD-10-CM

## 2015-03-25 ENCOUNTER — Encounter: Payer: Self-pay | Admitting: Internal Medicine

## 2015-03-25 ENCOUNTER — Ambulatory Visit (INDEPENDENT_AMBULATORY_CARE_PROVIDER_SITE_OTHER): Payer: BLUE CROSS/BLUE SHIELD | Admitting: Internal Medicine

## 2015-03-25 ENCOUNTER — Telehealth: Payer: Self-pay

## 2015-03-25 ENCOUNTER — Ambulatory Visit (INDEPENDENT_AMBULATORY_CARE_PROVIDER_SITE_OTHER)
Admission: RE | Admit: 2015-03-25 | Discharge: 2015-03-25 | Disposition: A | Discharge status: Home / Self Care | Payer: BLUE CROSS/BLUE SHIELD | Source: Ambulatory Visit | Attending: Internal Medicine | Admitting: Internal Medicine

## 2015-03-25 VITALS — BP 110/68 | HR 95 | Temp 98.1°F | Wt 147.0 lb

## 2015-03-25 DIAGNOSIS — R1011 Right upper quadrant pain: Secondary | ICD-10-CM

## 2015-03-25 DIAGNOSIS — M549 Dorsalgia, unspecified: Secondary | ICD-10-CM

## 2015-03-25 DIAGNOSIS — G8929 Other chronic pain: Secondary | ICD-10-CM

## 2015-03-25 DIAGNOSIS — R918 Other nonspecific abnormal finding of lung field: Secondary | ICD-10-CM

## 2015-03-25 MED ORDER — HYDROCODONE-ACETAMINOPHEN 10-325 MG PO TABS: 1.0000 | ORAL_TABLET | Freq: Two times a day (BID) | ORAL | Status: DC | PRN

## 2015-03-25 NOTE — Progress Notes (Signed)
Subjective:    Patient ID: Catherine Griffin, female    DOB: 01/09/1956, 59 y.o.   MRN: [id number]  HPI Here for review of her pain  Dull or sharp pain--"all the time" Not related to eating or activity Pain is in RUQ Has gained considerable weight in past few years--?wear belt pushes in  No vomiting Some nausea feeling if hungry Bowels are normal No longer radiating around to the back  Current Outpatient Prescriptions on File Prior to Visit  Medication Sig Dispense Refill  . cholecalciferol (VITAMIN D) 1000 UNITS tablet Take 1,000 Units by mouth daily.    . CYANOCOBALAMIN PO Take 1 tablet by mouth daily.    . cyclobenzaprine (FLEXERIL) 10 MG tablet TAKE 1/2 TO 1 TABLETS (5-10 MG TOTAL) BY MOUTH AT BEDTIME AS NEEDED FOR MUSCLE SPASMS. 30 tablet 0  . ECHINACEA HERB PO Take 1 tablet by mouth daily.    Marland Kitchen. HYDROcodone-acetaminophen (NORCO) 10-325 MG tablet Take 1 tablet by mouth 2 (two) times daily as needed. 60 tablet 0  . ibuprofen (ADVIL,MOTRIN) 200 MG tablet Take 400 mg by mouth 3 (three) times daily.     No current facility-administered medications on file prior to visit.    Allergies  Allergen Reactions  . Codeine Phosphate [Codeine] Nausea Only  . Indomethacin     REACTION: Rash/hives/swelling    Past Medical History  Diagnosis Date  . Osteoporosis     04/25/07  . Thyroid disease     hypothyrodism  . Arthritis   . Lichen sclerosus   . Back pain     04/25/07  . Actinic keratosis     04/25/07  . Vaginitis     10/01/10  . Diverticulosis of colon     02/28/09  . Fatigue     05/30/08  . Chest pain     04/25/07  . RUQ abdominal pain     02/07/09  . Hemorrhoid     12/01/10  . Abdominal pain   . Hyperlipidemia   . Hypothyroidism   . Abdominal pain, right upper quadrant   . Aphthous ulcer of mouth   . Hepatitis C   . Osteopenia     Past Surgical History  Procedure Laterality Date  . Abdominal hysterectomy      Family History  Problem Relation Age of  Onset  . Heart disease Father   . Arthritis Father   . Other Father     joint replacement  . Coronary artery disease Neg Hx   . Diabetes Neg Hx   . Cancer Mother     lung  . Anemia Sister   . Other Sister     CHF    Social History   Social History  . Marital Status: Married    Spouse Name: N/A  . Number of Children: N/A  . Years of Education: N/A   Occupational History  . runs pool service for 7 locations    Social History Main Topics  . Smoking status: Current Every Day Smoker    Types: Cigarettes  . Smokeless tobacco: Never Used  . Alcohol Use: No  . Drug Use: No  . Sexual Activity: Not on file   Other Topics Concern  . Not on file   Social History Narrative   2 step children   Review of Systems No weight loss No cough No SOB No ready to stop smoking    Objective:   Physical Exam  Constitutional: She appears well-developed and well-nourished. No  distress.  Pulmonary/Chest: Effort normal and breath sounds normal. No respiratory distress. She has no wheezes. She has no rales.  Abdominal: Soft. Bowel sounds are normal. She exhibits no distension. There is no tenderness. There is no rebound and no guarding.          Assessment & Plan:

## 2015-03-25 NOTE — Telephone Encounter (Signed)
[facility]; already in epic. Copy of report taken to Dr Alphonsus SiasLetvak.

## 2015-03-25 NOTE — Telephone Encounter (Signed)
Discussed with her Likely fat pad but will check CT scan per radiology recommendation

## 2015-03-25 NOTE — Progress Notes (Signed)
Pre visit review using our clinic review tool, if applicable. No additional management support is needed unless otherwise documented below in the visit note. 

## 2015-03-25 NOTE — Assessment & Plan Note (Signed)
Doesn't sound like true GI pathology--more likely in abdominal wall Given her smoking history--will check CXR (she doesn't want lung cancer screening at this point) Cancel CT She will call if any worrisome changes

## 2015-03-26 ENCOUNTER — Ambulatory Visit
Admission: RE | Admit: 2015-03-26 | Discharge: 2015-03-26 | Disposition: A | Discharge status: Home / Self Care | Payer: BLUE CROSS/BLUE SHIELD | Source: Ambulatory Visit | Attending: Internal Medicine | Admitting: Internal Medicine

## 2015-03-26 DIAGNOSIS — R918 Other nonspecific abnormal finding of lung field: Secondary | ICD-10-CM

## 2015-03-26 DIAGNOSIS — J984 Other disorders of lung: Secondary | ICD-10-CM | POA: Diagnosis present

## 2015-03-26 MED ORDER — IOHEXOL 300 MG/ML  SOLN
75.0000 mL | Freq: Once | INTRAMUSCULAR | Status: AC | PRN
  Administered 2015-03-26: 75 mL via INTRAVENOUS

## 2015-03-28 ENCOUNTER — Ambulatory Visit: Payer: BLUE CROSS/BLUE SHIELD

## 2015-04-02 ENCOUNTER — Other Ambulatory Visit: Payer: Self-pay | Admitting: Internal Medicine

## 2015-04-23 ENCOUNTER — Encounter: Payer: Self-pay | Admitting: Internal Medicine

## 2015-04-23 DIAGNOSIS — M549 Dorsalgia, unspecified: Principal | ICD-10-CM

## 2015-04-23 DIAGNOSIS — G8929 Other chronic pain: Secondary | ICD-10-CM

## 2015-04-24 MED ORDER — HYDROCODONE-ACETAMINOPHEN 10-325 MG PO TABS: 1.0000 | ORAL_TABLET | Freq: Two times a day (BID) | ORAL | Status: DC | PRN

## 2015-04-24 NOTE — Telephone Encounter (Signed)
Sent patient message back thru my-chart, that prescription is ready for pick-up and will be at the front desk.  

## 2015-04-24 NOTE — Telephone Encounter (Signed)
03/25/2015 

## 2015-05-03 ENCOUNTER — Encounter: Payer: Self-pay | Admitting: Gastroenterology

## 2015-05-03 ENCOUNTER — Other Ambulatory Visit: Payer: Self-pay | Admitting: Internal Medicine

## 2015-05-25 ENCOUNTER — Encounter: Payer: Self-pay | Admitting: Internal Medicine

## 2015-05-25 DIAGNOSIS — M549 Dorsalgia, unspecified: Principal | ICD-10-CM

## 2015-05-25 DIAGNOSIS — G8929 Other chronic pain: Secondary | ICD-10-CM

## 2015-05-26 MED ORDER — HYDROCODONE-ACETAMINOPHEN 10-325 MG PO TABS: 1.0000 | ORAL_TABLET | Freq: Two times a day (BID) | ORAL | Status: DC | PRN

## 2015-05-26 NOTE — Telephone Encounter (Signed)
02/12/2016

## 2015-05-26 NOTE — Telephone Encounter (Signed)
Sent patient message back thru my-chart, that prescription is ready for pick-up and will be at the front desk.  

## 2015-05-28 ENCOUNTER — Other Ambulatory Visit: Payer: Self-pay | Admitting: Internal Medicine

## 2015-06-16 ENCOUNTER — Other Ambulatory Visit: Payer: Self-pay | Admitting: Internal Medicine

## 2015-06-17 NOTE — Telephone Encounter (Signed)
Received refill electrophonically Last refill 05/28/15 #30 Last office visit 03/25/15

## 2015-06-17 NOTE — Telephone Encounter (Signed)
Rx sent electronically.  

## 2015-06-17 NOTE — Telephone Encounter (Signed)
Approved: #30 x 3 

## 2015-06-24 ENCOUNTER — Encounter: Payer: Self-pay | Admitting: Internal Medicine

## 2015-06-24 DIAGNOSIS — M549 Dorsalgia, unspecified: Principal | ICD-10-CM

## 2015-06-24 DIAGNOSIS — G8929 Other chronic pain: Secondary | ICD-10-CM

## 2015-06-24 NOTE — Telephone Encounter (Signed)
Last refill 05-26-15 #60/0 Last OV 03-25-15 Next OV 10-27-15 Please bring written rx back to me when done. Thanks

## 2015-06-26 ENCOUNTER — Encounter: Payer: Self-pay | Admitting: Internal Medicine

## 2015-06-26 MED ORDER — HYDROCODONE-ACETAMINOPHEN 10-325 MG PO TABS: 1.0000 | ORAL_TABLET | Freq: Two times a day (BID) | ORAL | Status: DC | PRN

## 2015-06-26 NOTE — Telephone Encounter (Signed)
Printed and in Kim's box 

## 2015-06-26 NOTE — Telephone Encounter (Signed)
RX up fron to pickup. Pt aware through MyChart

## 2015-06-26 NOTE — Telephone Encounter (Signed)
[facility]

## 2015-07-29 ENCOUNTER — Encounter: Payer: Self-pay | Admitting: Internal Medicine

## 2015-07-29 DIAGNOSIS — G8929 Other chronic pain: Secondary | ICD-10-CM

## 2015-07-29 DIAGNOSIS — M549 Dorsalgia, unspecified: Principal | ICD-10-CM

## 2015-07-29 MED ORDER — HYDROCODONE-ACETAMINOPHEN 10-325 MG PO TABS: 1.0000 | ORAL_TABLET | Freq: Two times a day (BID) | ORAL | Status: DC | PRN

## 2015-07-29 NOTE — Telephone Encounter (Signed)
MyChart message sent to patient. Rx up front

## 2015-07-29 NOTE — Telephone Encounter (Signed)
Last refill 06/26/15 #60, Last office visit 03/25/15

## 2015-08-27 ENCOUNTER — Encounter: Payer: Self-pay | Admitting: Internal Medicine

## 2015-08-27 DIAGNOSIS — G8929 Other chronic pain: Secondary | ICD-10-CM

## 2015-08-27 DIAGNOSIS — M549 Dorsalgia, unspecified: Principal | ICD-10-CM

## 2015-08-27 MED ORDER — HYDROCODONE-ACETAMINOPHEN 10-325 MG PO TABS: 1.0000 | ORAL_TABLET | Freq: Two times a day (BID) | ORAL | Status: DC | PRN

## 2015-08-27 NOTE — Telephone Encounter (Signed)
Last filled 07-29-15 #60. Last OV 03-25-15 Next OV 10-27-15. RX printed and waiting for signature tomorrow

## 2015-09-25 ENCOUNTER — Encounter: Payer: Self-pay | Admitting: Internal Medicine

## 2015-09-25 DIAGNOSIS — M549 Dorsalgia, unspecified: Principal | ICD-10-CM

## 2015-09-25 DIAGNOSIS — G8929 Other chronic pain: Secondary | ICD-10-CM

## 2015-09-25 MED ORDER — HYDROCODONE-ACETAMINOPHEN 10-325 MG PO TABS: 1.0000 | ORAL_TABLET | Freq: Two times a day (BID) | ORAL | Status: DC | PRN

## 2015-09-25 NOTE — Telephone Encounter (Signed)
Rx done. 

## 2015-09-25 NOTE — Telephone Encounter (Signed)
Last rx written 08-27-15 Last OV 03-25-15 Next OV 10-27-15

## 2015-09-26 NOTE — Telephone Encounter (Signed)
MyChart message sent to pt

## 2015-10-14 ENCOUNTER — Other Ambulatory Visit: Payer: Self-pay | Admitting: Internal Medicine

## 2015-10-15 NOTE — Telephone Encounter (Signed)
Approved: #30 x 3 

## 2015-10-15 NOTE — Telephone Encounter (Signed)
Last filled 09-16-15 #30 Last OV 03-25-15 Next OV 10-27-15

## 2015-10-27 ENCOUNTER — Encounter: Payer: Self-pay | Admitting: Internal Medicine

## 2015-10-27 ENCOUNTER — Ambulatory Visit (INDEPENDENT_AMBULATORY_CARE_PROVIDER_SITE_OTHER): Payer: BLUE CROSS/BLUE SHIELD | Admitting: Internal Medicine

## 2015-10-27 VITALS — BP 110/60 | HR 78 | Temp 97.6°F | Ht 62.25 in | Wt 138.0 lb

## 2015-10-27 DIAGNOSIS — G8929 Other chronic pain: Secondary | ICD-10-CM | POA: Diagnosis not present

## 2015-10-27 DIAGNOSIS — M549 Dorsalgia, unspecified: Secondary | ICD-10-CM | POA: Diagnosis not present

## 2015-10-27 DIAGNOSIS — Z0000 Encounter for general adult medical examination without abnormal findings: Secondary | ICD-10-CM

## 2015-10-27 DIAGNOSIS — E785 Hyperlipidemia, unspecified: Secondary | ICD-10-CM

## 2015-10-27 MED ORDER — HYDROCODONE-ACETAMINOPHEN 10-325 MG PO TABS: 1.0000 | ORAL_TABLET | Freq: Two times a day (BID) | ORAL | Status: DC | PRN

## 2015-10-27 NOTE — Progress Notes (Signed)
Subjective:    Patient ID: Catherine Griffin, female    DOB: 06/08/1955, 60 y.o.   MRN: [id number]  HPI Here for physical  Still smoking  Hasn't tried to quit---precontemplative Does better trying to quit in winter Did use patch in the past  Still with the chronic back pain norco helps and the flexeril helps her at night Checked CSRS and she has no other prescriptions than here  Current Outpatient Prescriptions on File Prior to Visit  Medication Sig Dispense Refill  . cholecalciferol (VITAMIN D) 1000 UNITS tablet Take 1,000 Units by mouth daily.    . CYANOCOBALAMIN PO Take 1 tablet by mouth daily.    . cyclobenzaprine (FLEXERIL) 10 MG tablet TAKE 1/2 TO 1 TABLETS (5-10 MG TOTAL) BY MOUTH AT BEDTIME AS NEEDED FOR MUSCLE SPASMS. 30 tablet 3  . ECHINACEA HERB PO Take 1 tablet by mouth daily.    Marland Kitchen. HYDROcodone-acetaminophen (NORCO) 10-325 MG tablet Take 1 tablet by mouth 2 (two) times daily as needed. 60 tablet 0  . ibuprofen (ADVIL,MOTRIN) 200 MG tablet Take 400 mg by mouth 3 (three) times daily.     No current facility-administered medications on file prior to visit.    Allergies  Allergen Reactions  . Codeine Phosphate [Codeine] Nausea Only  . Indomethacin     REACTION: Rash/hives/swelling    Past Medical History  Diagnosis Date  . Osteoporosis     04/25/07  . Thyroid disease     hypothyrodism  . Arthritis   . Lichen sclerosus   . Back pain     04/25/07  . Actinic keratosis     04/25/07  . Vaginitis     10/01/10  . Diverticulosis of colon     02/28/09  . Fatigue     05/30/08  . Chest pain     04/25/07  . RUQ abdominal pain     02/07/09  . Hemorrhoid     12/01/10  . Abdominal pain   . Hyperlipidemia   . Hypothyroidism   . Abdominal pain, right upper quadrant   . Aphthous ulcer of mouth   . Hepatitis C   . Osteopenia     Past Surgical History  Procedure Laterality Date  . Abdominal hysterectomy      Family History  Problem Relation Age of Onset  .  Heart disease Father   . Arthritis Father   . Other Father     joint replacement  . Coronary artery disease Neg Hx   . Diabetes Neg Hx   . Cancer Mother     lung  . Anemia Sister   . Other Sister     CHF    Social History   Social History  . Marital Status: Married    Spouse Name: N/A  . Number of Children: N/A  . Years of Education: N/A   Occupational History  . runs pool service for 7 locations    Social History Main Topics  . Smoking status: Current Every Day Smoker    Types: Cigarettes  . Smokeless tobacco: Never Used  . Alcohol Use: No  . Drug Use: No  . Sexual Activity: Not on file   Other Topics Concern  . Not on file   Social History Narrative   2 step children   Review of Systems  Constitutional: Negative for fatigue and unexpected weight change.       Loses weight in summer Wears seat belt  HENT: Negative for hearing loss, tinnitus and  trouble swallowing.        Lots of dental work  Eyes: Negative for visual disturbance.       No diplopia or unilateral vision loss  Respiratory: Positive for cough. Negative for chest tightness and shortness of breath.   Cardiovascular: Negative for chest pain, palpitations and leg swelling.  Gastrointestinal: Negative for nausea, constipation and blood in stool.       Still some sporadic RUQ pain--goes back years. Will pop up for several days--then go away.   Endocrine: Negative for polydipsia and polyuria.  Genitourinary: Negative for dysuria, urgency, difficulty urinating and dyspareunia.  Musculoskeletal: Positive for back pain. Negative for joint swelling and arthralgias.  Skin: Negative for rash.       No suspicious lesions   Allergic/Immunologic: Positive for environmental allergies. Negative for immunocompromised state.       Mild symptoms Just uses saline nasal spray  Neurological: Positive for headaches. Negative for dizziness, syncope and light-headedness.       Just takes aspirin for headaches    Hematological: Negative for adenopathy. Bruises/bleeds easily.  Psychiatric/Behavioral: Negative for dysphoric mood. The patient is not nervous/anxious.        Back pain affects her sleep--has to get out of bed most nights       Objective:   Physical Exam  Constitutional: She is oriented to person, place, and time. She appears well-developed and well-nourished. No distress.  HENT:  Head: Normocephalic and atraumatic.  Right Ear: External ear normal.  Mouth/Throat: Oropharynx is clear and moist. No oropharyngeal exudate.  Eyes: Conjunctivae are normal. Pupils are equal, round, and reactive to light.  Neck: Normal range of motion. Neck supple. No thyromegaly present.  Cardiovascular: Normal rate, regular rhythm, normal heart sounds and intact distal pulses.  Exam reveals no gallop.   No murmur heard. Pulmonary/Chest: Effort normal and breath sounds normal. No respiratory distress. She has no wheezes. She has no rales.  Abdominal: Soft. There is no tenderness.  Musculoskeletal: She exhibits no edema or tenderness.  Lymphadenopathy:    She has no cervical adenopathy.  Neurological: She is alert and oriented to person, place, and time.  Skin: No rash noted. No erythema.  Psychiatric: She has a normal mood and affect. Her behavior is normal.          Assessment & Plan:

## 2015-10-27 NOTE — Assessment & Plan Note (Signed)
Mild Not really interested in meds for primary prevention

## 2015-10-27 NOTE — Assessment & Plan Note (Addendum)
Discussed cigarette cessation---patch/lozenge when ready Tries to walk reguarly Colon due 2019 mammo due in March 2018 No pap due to hyster Prefers no zostavax or flu---recommended both though

## 2015-10-27 NOTE — Assessment & Plan Note (Signed)
Does well with regimen No concerns with check on CSRS

## 2015-10-27 NOTE — Patient Instructions (Signed)
Please set up your screening mammogram in March 2018

## 2015-10-27 NOTE — Progress Notes (Signed)
Pre visit review using our clinic review tool, if applicable. No additional management support is needed unless otherwise documented below in the visit note. 

## 2015-11-24 ENCOUNTER — Encounter: Payer: Self-pay | Admitting: Internal Medicine

## 2015-11-24 DIAGNOSIS — G8929 Other chronic pain: Secondary | ICD-10-CM

## 2015-11-24 DIAGNOSIS — M549 Dorsalgia, unspecified: Principal | ICD-10-CM

## 2015-11-25 MED ORDER — HYDROCODONE-ACETAMINOPHEN 10-325 MG PO TABS: 1.0000 | ORAL_TABLET | Freq: Two times a day (BID) | ORAL | 0 refills | Status: DC | PRN

## 2015-11-25 NOTE — Telephone Encounter (Signed)
Patient notified that script is up front ready for pickup per Dr. Alphonsus SiasLetvak.

## 2015-11-25 NOTE — Telephone Encounter (Signed)
Last refill 10/27/15 #60, last office visit same date

## 2015-12-24 ENCOUNTER — Encounter: Payer: Self-pay | Admitting: Internal Medicine

## 2015-12-24 DIAGNOSIS — G8929 Other chronic pain: Secondary | ICD-10-CM

## 2015-12-24 DIAGNOSIS — M549 Dorsalgia, unspecified: Principal | ICD-10-CM

## 2015-12-24 NOTE — Telephone Encounter (Signed)
Last written 11-25-15 #60 Last OV 10-27-15 Next OV 10-29-16

## 2015-12-25 MED ORDER — HYDROCODONE-ACETAMINOPHEN 10-325 MG PO TABS: 1.0000 | ORAL_TABLET | Freq: Two times a day (BID) | ORAL | 0 refills | Status: DC | PRN

## 2015-12-25 NOTE — Telephone Encounter (Signed)
MyChart Message sent to pt that rx is up front

## 2016-01-22 ENCOUNTER — Encounter: Payer: Self-pay | Admitting: Internal Medicine

## 2016-01-22 MED ORDER — HYDROCODONE-ACETAMINOPHEN 10-325 MG PO TABS: 1.0000 | ORAL_TABLET | Freq: Two times a day (BID) | ORAL | 0 refills | Status: DC | PRN

## 2016-01-22 NOTE — Telephone Encounter (Signed)
Last filled 12-25-15 #60 Last OV 10-26-25 Next OV 10-29-16

## 2016-01-28 ENCOUNTER — Other Ambulatory Visit: Payer: Self-pay | Admitting: Internal Medicine

## 2016-01-28 NOTE — Telephone Encounter (Signed)
Approved: #30 x 2 

## 2016-01-28 NOTE — Telephone Encounter (Signed)
Last filled 01-02-16 #30 Last OV 10-27-15 Next OV 10-29-16

## 2016-02-20 ENCOUNTER — Other Ambulatory Visit: Payer: Self-pay

## 2016-02-20 ENCOUNTER — Encounter: Payer: Self-pay | Admitting: Internal Medicine

## 2016-02-20 MED ORDER — HYDROCODONE-ACETAMINOPHEN 10-325 MG PO TABS: 1.0000 | ORAL_TABLET | Freq: Two times a day (BID) | ORAL | 0 refills | Status: DC | PRN

## 2016-02-20 NOTE — Telephone Encounter (Signed)
Last filled 01-22-16 #60 Last OV 10-27-15 Next OV 10-29-16 Forward to Dr Para Marchuncan in Dr Sharen HonesGutierrez in Dr Karle StarchLetvak's absence

## 2016-02-20 NOTE — Telephone Encounter (Signed)
Printed and in Kim's box 

## 2016-02-20 NOTE — Telephone Encounter (Signed)
Spoke to pt. Rx up front ready for pickup 

## 2016-02-23 ENCOUNTER — Other Ambulatory Visit: Payer: Self-pay | Admitting: Internal Medicine

## 2016-03-20 ENCOUNTER — Other Ambulatory Visit: Payer: Self-pay | Admitting: Internal Medicine

## 2016-03-22 ENCOUNTER — Encounter: Payer: Self-pay | Admitting: Internal Medicine

## 2016-03-22 MED ORDER — HYDROCODONE-ACETAMINOPHEN 10-325 MG PO TABS: 1.0000 | ORAL_TABLET | Freq: Two times a day (BID) | ORAL | 0 refills | Status: DC | PRN

## 2016-03-22 MED ORDER — CYCLOBENZAPRINE HCL 10 MG PO TABS: ORAL_TABLET | ORAL | 0 refills | Status: DC

## 2016-03-22 NOTE — Telephone Encounter (Signed)
Hydrocodone last written 02-20-16 #60 Last OV 10-27-15 Next OV 10-29-16  Forward to Dr Dayton MartesAron in Dr Karle StarchLetvak's absence.

## 2016-04-14 ENCOUNTER — Other Ambulatory Visit: Payer: Self-pay | Admitting: Internal Medicine

## 2016-04-19 ENCOUNTER — Encounter: Payer: Self-pay | Admitting: Internal Medicine

## 2016-04-20 MED ORDER — HYDROCODONE-ACETAMINOPHEN 10-325 MG PO TABS: 1.0000 | ORAL_TABLET | Freq: Two times a day (BID) | ORAL | 0 refills | Status: DC | PRN

## 2016-04-20 NOTE — Telephone Encounter (Signed)
Last written 03-22-16 #60 Last OV 10-27-15 Next OV 10-29-16

## 2016-05-10 ENCOUNTER — Other Ambulatory Visit: Payer: Self-pay | Admitting: Internal Medicine

## 2016-05-11 ENCOUNTER — Other Ambulatory Visit: Payer: Self-pay | Admitting: Internal Medicine

## 2016-05-11 DIAGNOSIS — Z1231 Encounter for screening mammogram for malignant neoplasm of breast: Secondary | ICD-10-CM

## 2016-05-19 ENCOUNTER — Encounter: Payer: Self-pay | Admitting: Internal Medicine

## 2016-05-19 MED ORDER — HYDROCODONE-ACETAMINOPHEN 10-325 MG PO TABS: 1.0000 | ORAL_TABLET | Freq: Two times a day (BID) | ORAL | 0 refills | Status: DC | PRN

## 2016-05-19 NOTE — Telephone Encounter (Signed)
Last written 04-20-16 #60 Last OV 10-27-15 Next OV 10-29-16

## 2016-05-20 ENCOUNTER — Ambulatory Visit: Payer: BLUE CROSS/BLUE SHIELD

## 2016-06-04 ENCOUNTER — Encounter: Payer: Self-pay | Admitting: Internal Medicine

## 2016-06-04 ENCOUNTER — Ambulatory Visit (INDEPENDENT_AMBULATORY_CARE_PROVIDER_SITE_OTHER)
Admission: RE | Admit: 2016-06-04 | Discharge: 2016-06-04 | Disposition: A | Discharge status: Home / Self Care | Payer: BLUE CROSS/BLUE SHIELD | Source: Ambulatory Visit | Attending: Internal Medicine | Admitting: Internal Medicine

## 2016-06-04 ENCOUNTER — Ambulatory Visit (INDEPENDENT_AMBULATORY_CARE_PROVIDER_SITE_OTHER): Payer: BLUE CROSS/BLUE SHIELD | Admitting: Internal Medicine

## 2016-06-04 VITALS — BP 104/70 | HR 84 | Temp 97.4°F | Wt 146.0 lb

## 2016-06-04 DIAGNOSIS — J449 Chronic obstructive pulmonary disease, unspecified: Secondary | ICD-10-CM | POA: Insufficient documentation

## 2016-06-04 DIAGNOSIS — M5442 Lumbago with sciatica, left side: Secondary | ICD-10-CM

## 2016-06-04 DIAGNOSIS — G8929 Other chronic pain: Secondary | ICD-10-CM | POA: Diagnosis not present

## 2016-06-04 DIAGNOSIS — M5441 Lumbago with sciatica, right side: Secondary | ICD-10-CM | POA: Diagnosis not present

## 2016-06-04 DIAGNOSIS — J441 Chronic obstructive pulmonary disease with (acute) exacerbation: Secondary | ICD-10-CM | POA: Diagnosis not present

## 2016-06-04 MED ORDER — PREDNISONE 20 MG PO TABS: 40.0000 mg | ORAL_TABLET | Freq: Every day | ORAL | 0 refills | Status: DC

## 2016-06-04 NOTE — Progress Notes (Signed)
Subjective:    Patient ID: Catherine Griffin, female    DOB: 1955-11-25, 61 y.o.   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  Can't sleep in bed---has to go to couch No leg weakness Some burning in legs and feet  Also with bad cold Husband also sick--- not flu though. He was given z-pak She started 2 days ago Took 3 of his z-pak Fever the first night--then broke Nasal congestion Some cough---some mucus Some sore throat  No ear pain Also using mucinex  Current Outpatient Prescriptions on File Prior to Visit  Medication Sig Dispense Refill  . cholecalciferol (VITAMIN D) 1000 UNITS tablet Take 1,000 Units by mouth daily.    . CYANOCOBALAMIN PO Take 1 tablet by mouth daily.    . cyclobenzaprine (FLEXERIL) 10 MG tablet TAKE 1/2 TO 1 TABLETS (5-10 MG TOTAL) BY MOUTH AT BEDTIME AS NEEDED FOR MUSCLE SPASMS. 30 tablet 0  . ECHINACEA HERB PO Take 1 tablet by mouth daily.    Marland Kitchen HYDROcodone-acetaminophen (NORCO) 10-325 MG tablet Take 1 tablet by mouth 2 (two) times daily as needed. 60 tablet 0  . ibuprofen (ADVIL,MOTRIN) 200 MG tablet Take 400 mg by mouth 3 (three) times daily.     No current facility-administered medications on file prior to visit.     Allergies  Allergen Reactions  . Codeine Phosphate [Codeine] Nausea Only  . Indomethacin     REACTION: Rash/hives/swelling    Past Medical History:  Diagnosis Date  . Abdominal pain   . Abdominal pain, right upper quadrant   . Actinic keratosis    04/25/07  . Aphthous ulcer of mouth   . Arthritis   . Back pain    04/25/07  . Chest pain    04/25/07  . Diverticulosis of colon    02/28/09  . Fatigue    05/30/08  . Hemorrhoid    12/01/10  . Hepatitis C   . Hyperlipidemia   . Hypothyroidism   . Lichen sclerosus     . Osteopenia   . Osteoporosis    04/25/07  . RUQ abdominal pain    02/07/09  . Thyroid disease    hypothyrodism  . Vaginitis    10/01/10    Past Surgical History:  Procedure Laterality Date  . ABDOMINAL HYSTERECTOMY      Family History  Problem Relation Age of Onset  . Heart disease Father   . Arthritis Father   . Other Father     joint replacement  . Coronary artery disease Neg Hx   . Diabetes Neg Hx   . Cancer Mother     lung  . Anemia Sister   . Other Sister     CHF    Social History   Social History  . Marital status: Married    Spouse name: N/A  . Number of children: N/A  . Years of education: N/A   Occupational History  . 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 Smoking status: Current Every Day Smoker    Types: Cigarettes  . Smokeless tobacco: Never Used  . Alcohol use No  . Drug use: No  . Sexual activity: Not on file   Other Topics Concern  . Not on file  Social History Narrative   2 step children   Review of Systems  No loss of bowel or bladder control Weight stable Appetite is fine     Objective:   Physical Exam  Constitutional: She appears well-nourished. No distress.  HENT:  Mouth/Throat: Oropharynx is clear and moist. No oropharyngeal exudate.  No sinus tenderness TMs normal Moderate nasal congestion  Neck: Neck supple.  Pulmonary/Chest: Effort normal. No respiratory distress. She has no rales.  Mild prolonged exp phase and wheezing  Musculoskeletal:  Slight low back tenderness over spine and to right SLR negative Fairly full back flexion  Lymphadenopathy:    She has no cervical adenopathy.  Neurological:  Fairly normal gait--slightly antalgic No focal leg weakness Normal tone          Assessment & Plan:

## 2016-06-04 NOTE — Assessment & Plan Note (Signed)
I suspect significant spondylosis Discussed that surgery is very iffy Will just check plain x-ray to be sure no surprises MRI if weakness or progression PT referral

## 2016-06-04 NOTE — Progress Notes (Signed)
Pre visit review using our clinic review tool, if applicable. No additional management support is needed unless otherwise documented below in the visit note. 

## 2016-06-04 NOTE — Assessment & Plan Note (Signed)
Discussed that she has a classic viral infection Now with clear signs of COPD with this illness Again urged her to stop smoking Prednisone for 3 days

## 2016-06-07 ENCOUNTER — Other Ambulatory Visit: Payer: Self-pay | Admitting: Internal Medicine

## 2016-06-11 ENCOUNTER — Ambulatory Visit
Admission: RE | Admit: 2016-06-11 | Discharge: 2016-06-11 | Disposition: A | Discharge status: Home / Self Care | Payer: BLUE CROSS/BLUE SHIELD | Source: Ambulatory Visit | Attending: Internal Medicine | Admitting: Internal Medicine

## 2016-06-11 DIAGNOSIS — Z1231 Encounter for screening mammogram for malignant neoplasm of breast: Secondary | ICD-10-CM | POA: Diagnosis not present

## 2016-06-16 ENCOUNTER — Encounter: Payer: Self-pay | Admitting: Internal Medicine

## 2016-06-16 MED ORDER — HYDROCODONE-ACETAMINOPHEN 10-325 MG PO TABS: 1.0000 | ORAL_TABLET | Freq: Two times a day (BID) | ORAL | 0 refills | Status: DC | PRN

## 2016-06-16 NOTE — Telephone Encounter (Signed)
Last Rx 05/19/16. Last OV 05/2016

## 2016-06-17 ENCOUNTER — Other Ambulatory Visit: Payer: BLUE CROSS/BLUE SHIELD

## 2016-06-17 ENCOUNTER — Encounter: Payer: Self-pay | Admitting: Internal Medicine

## 2016-06-17 DIAGNOSIS — Z0283 Encounter for blood-alcohol and blood-drug test: Secondary | ICD-10-CM

## 2016-06-23 LAB — TOXASSURE SELECT 13 (MW), URINE

## 2016-07-05 ENCOUNTER — Other Ambulatory Visit: Payer: Self-pay | Admitting: Internal Medicine

## 2016-07-18 ENCOUNTER — Encounter: Payer: Self-pay | Admitting: Internal Medicine

## 2016-07-19 MED ORDER — HYDROCODONE-ACETAMINOPHEN 10-325 MG PO TABS: 1.0000 | ORAL_TABLET | Freq: Two times a day (BID) | ORAL | 0 refills | Status: DC | PRN

## 2016-07-19 NOTE — Telephone Encounter (Signed)
Last written 06-16-16 #60 Last OV 06-04-16 Next OV 10-29-16

## 2016-08-01 ENCOUNTER — Other Ambulatory Visit: Payer: Self-pay | Admitting: Internal Medicine

## 2016-08-02 NOTE — Telephone Encounter (Signed)
Last OV 06/2016. Last Rx 07/05/2016 #30. pls advise

## 2016-08-02 NOTE — Telephone Encounter (Signed)
Approved: #30 x 1 

## 2016-08-11 ENCOUNTER — Encounter: Payer: Self-pay | Admitting: Internal Medicine

## 2016-08-16 ENCOUNTER — Encounter: Payer: Self-pay | Admitting: Internal Medicine

## 2016-08-17 MED ORDER — HYDROCODONE-ACETAMINOPHEN 10-325 MG PO TABS: 1.0000 | ORAL_TABLET | Freq: Two times a day (BID) | ORAL | 0 refills | Status: DC | PRN

## 2016-08-17 NOTE — Telephone Encounter (Signed)
Last filled 07-19-16 #60 Last OV 06-04-16 Next OV 10-29-16

## 2016-08-17 NOTE — Telephone Encounter (Signed)
MyChart message sent to pt. Rx up front ready for pickup 

## 2016-09-15 ENCOUNTER — Encounter: Payer: Self-pay | Admitting: Internal Medicine

## 2016-09-15 MED ORDER — HYDROCODONE-ACETAMINOPHEN 10-325 MG PO TABS: 1.0000 | ORAL_TABLET | Freq: Two times a day (BID) | ORAL | 0 refills | Status: DC | PRN

## 2016-09-15 NOTE — Telephone Encounter (Signed)
Last printed 08-17-16 #60 Last OV 06-04-16 Next OV 10-29-16  UDS 06-17-16

## 2016-09-15 NOTE — Telephone Encounter (Signed)
Let her know how sorry I am about her sister's death

## 2016-09-22 ENCOUNTER — Other Ambulatory Visit: Payer: Self-pay | Admitting: Internal Medicine

## 2016-10-12 ENCOUNTER — Encounter: Payer: Self-pay | Admitting: Internal Medicine

## 2016-10-12 ENCOUNTER — Other Ambulatory Visit: Payer: Self-pay | Admitting: Internal Medicine

## 2016-10-14 MED ORDER — HYDROCODONE-ACETAMINOPHEN 10-325 MG PO TABS: 1.0000 | ORAL_TABLET | Freq: Two times a day (BID) | ORAL | 0 refills | Status: DC | PRN

## 2016-10-14 MED ORDER — CYCLOBENZAPRINE HCL 10 MG PO TABS: ORAL_TABLET | ORAL | 0 refills | Status: DC

## 2016-10-14 NOTE — Telephone Encounter (Signed)
Last printed 09-15-16 #60  Last OV 06-04-16 Next OV 10-29-16  Last UDS 06-17-16

## 2016-10-29 ENCOUNTER — Ambulatory Visit (INDEPENDENT_AMBULATORY_CARE_PROVIDER_SITE_OTHER): Payer: BLUE CROSS/BLUE SHIELD | Admitting: Internal Medicine

## 2016-10-29 ENCOUNTER — Encounter: Payer: Self-pay | Admitting: Internal Medicine

## 2016-10-29 VITALS — BP 104/60 | HR 89 | Temp 97.7°F | Ht 62.25 in | Wt 138.0 lb

## 2016-10-29 DIAGNOSIS — J449 Chronic obstructive pulmonary disease, unspecified: Secondary | ICD-10-CM | POA: Diagnosis not present

## 2016-10-29 DIAGNOSIS — Z0000 Encounter for general adult medical examination without abnormal findings: Secondary | ICD-10-CM

## 2016-10-29 DIAGNOSIS — F1120 Opioid dependence, uncomplicated: Secondary | ICD-10-CM

## 2016-10-29 LAB — COMPREHENSIVE METABOLIC PANEL
ALBUMIN: 3.9 g/dL (ref 3.5–5.2)
ALT: 10 U/L (ref 0–35)
AST: 19 U/L (ref 0–37)
Alkaline Phosphatase: 61 U/L (ref 39–117)
BILIRUBIN TOTAL: 0.2 mg/dL (ref 0.2–1.2)
BUN: 21 mg/dL (ref 6–23)
CALCIUM: 9.5 mg/dL (ref 8.4–10.5)
CO2: 29 mEq/L (ref 19–32)
CREATININE: 0.9 mg/dL (ref 0.40–1.20)
Chloride: 103 mEq/L (ref 96–112)
GFR: 67.16 mL/min (ref >60.00)
Glucose, Bld: 95 mg/dL (ref 70–99)
Potassium: 3.8 mEq/L (ref 3.5–5.1)
Sodium: 139 mEq/L (ref 135–145)
TOTAL PROTEIN: 6.9 g/dL (ref 6.0–8.3)

## 2016-10-29 LAB — CBC WITH DIFFERENTIAL/PLATELET
BASOS ABS: 0 10*3/uL (ref 0.0–0.1)
Basophils Relative: 0.5 % (ref 0.0–3.0)
EOS ABS: 0.2 10*3/uL (ref 0.0–0.7)
Eosinophils Relative: 2.5 % (ref 0.0–5.0)
HCT: 42.1 % (ref 36.0–46.0)
Hemoglobin: 14.2 g/dL (ref 12.0–15.0)
LYMPHS PCT: 39.4 % (ref 12.0–46.0)
Lymphs Abs: 2.8 10*3/uL (ref 0.7–4.0)
MCHC: 33.7 g/dL (ref 30.0–36.0)
MCV: 94.1 fl (ref 78.0–100.0)
Monocytes Absolute: 0.6 10*3/uL (ref 0.1–1.0)
Monocytes Relative: 8.1 % (ref 3.0–12.0)
NEUTROS ABS: 3.5 10*3/uL (ref 1.4–7.7)
NEUTROS PCT: 49.5 % (ref 43.0–77.0)
PLATELETS: 221 10*3/uL (ref 150.0–400.0)
RBC: 4.47 Mil/uL (ref 3.87–5.11)
RDW: 14.2 % (ref 11.5–15.5)
WBC: 7.1 10*3/uL (ref 4.0–10.5)

## 2016-10-29 NOTE — Progress Notes (Signed)
Subjective:    Patient ID: Catherine Griffin, female    DOB: 06-24-1955, 61 y.o.   MRN: [id number]  HPI Here for physical and follow up of chronic medical conditions  Doing okay Chronic back pain Doing okay with the pain med and cyclobenzaprine at bedtime Still busy with pool maintenance business  Still smokes Not ready to stop Discussed mild emphysema on CT scan Regular cough Some hoarseness---- comes and goes (better if she rests her voice) Has had ENT evaluations No heartburn or dysphagia  Current Outpatient Prescriptions on File Prior to Visit  Medication Sig Dispense Refill  . cholecalciferol (VITAMIN D) 1000 UNITS tablet Take 1,000 Units by mouth daily.    . CYANOCOBALAMIN PO Take 1 tablet by mouth daily.    . cyclobenzaprine (FLEXERIL) 10 MG tablet 1/2 to 1 tablet by mouth at bedtime as needed. 30 tablet 0  . ECHINACEA HERB PO Take 1 tablet by mouth daily.    Marland Kitchen HYDROcodone-acetaminophen (NORCO) 10-325 MG tablet Take 1 tablet by mouth 2 (two) times daily as needed. 60 tablet 0  . ibuprofen (ADVIL,MOTRIN) 200 MG tablet Take 400 mg by mouth 3 (three) times daily.     No current facility-administered medications on file prior to visit.     Allergies  Allergen Reactions  . Codeine Phosphate [Codeine] Nausea Only  . Indomethacin     REACTION: Rash/hives/swelling    Past Medical History:  Diagnosis Date  . Abdominal pain   . Abdominal pain, right upper quadrant   . Actinic keratosis    04/25/07  . Aphthous ulcer of mouth   . Arthritis   . Back pain    04/25/07  . Chest pain    04/25/07  . Diverticulosis of colon    02/28/09  . Fatigue    05/30/08  . Hemorrhoid    12/01/10  . Hepatitis C   . Hyperlipidemia   . Hypothyroidism   . Lichen sclerosus   . Osteopenia   . Osteoporosis    04/25/07  . RUQ abdominal pain    02/07/09  . Thyroid disease    hypothyrodism  . Vaginitis    10/01/10    Past Surgical History:  Procedure Laterality Date  . ABDOMINAL  HYSTERECTOMY      Family History  Problem Relation Age of Onset  . Heart disease Father   . Arthritis Father   . Other Father        joint replacement  . Cancer Mother        lung  . Anemia Sister   . Other Sister        CHF  . Breast cancer Sister 56  . Coronary artery disease Neg Hx   . Diabetes Neg Hx     Social History   Social History  . Marital status: Married    Spouse name: N/A  . Number of children: N/A  . Years of education: N/A   Occupational History  . 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 Smoking status: Current Every Day Smoker    Types: Cigarettes  . Smokeless tobacco: Never Used  . Alcohol use No  . Drug use: No  . Sexual activity: Not on file   Other Topics Concern  . Not on file   Social History Narrative   2 step children   Review of Systems  Constitutional: Negative for fatigue and unexpected weight change.  Wears seat belt  HENT: Negative for dental problem, hearing loss and tinnitus.        Keeps up with dentist  Eyes: Negative for visual disturbance.       No diplopia or unilateral vision loss  Respiratory: Positive for cough. Negative for shortness of breath.   Cardiovascular: Negative for chest pain, palpitations and leg swelling.  Gastrointestinal: Negative for abdominal pain, blood in stool and constipation.  Genitourinary: Negative for difficulty urinating, dyspareunia, dysuria and hematuria.  Musculoskeletal: Positive for arthralgias and back pain. Negative for joint swelling.       Some foot and heel pain--- plantar fasciitis (discussed other arch support) Occasional knee pain  Skin: Negative for rash.  Allergic/Immunologic: Positive for environmental allergies. Negative for immunocompromised state.       Rare benedryl in spring  Neurological: Negative for dizziness, syncope, light-headedness and headaches.  Hematological: Negative for adenopathy. Bruises/bleeds easily.    Psychiatric/Behavioral: Positive for sleep disturbance. Negative for dysphoric mood. The patient is not nervous/anxious.        Trouble initiating sleep Needs the melatonin/flexeril Needs to go to couch at times       Objective:   Physical Exam  Constitutional: She is oriented to person, place, and time. She appears well-nourished. No distress.  HENT:  Head: Normocephalic and atraumatic.  Right Ear: External ear normal.  Left Ear: External ear normal.  Mouth/Throat: No oropharyngeal exudate.  Eyes: Pupils are equal, round, and reactive to light. Conjunctivae are normal.  Neck: Normal range of motion. No thyromegaly present.  Cardiovascular: Normal rate, regular rhythm, normal heart sounds and intact distal pulses.  Exam reveals no gallop.   No murmur heard. Pulmonary/Chest: Effort normal and breath sounds normal. No respiratory distress. She has no wheezes. She has no rales.  Abdominal: Soft. There is no tenderness.  Musculoskeletal: She exhibits no edema or tenderness.  Lymphadenopathy:    She has no cervical adenopathy.  Neurological: She is alert and oriented to person, place, and time.  Skin: No rash noted. No erythema.  Psychiatric: She has a normal mood and affect. Her behavior is normal.          Assessment & Plan:

## 2016-10-29 NOTE — Assessment & Plan Note (Signed)
Healthy No pap due to hyster Recommended prevnar--wants to hold off (but may get with flu vaccine) Colon due 11/19 Just had mammo--recommended every 2 years

## 2016-10-29 NOTE — Assessment & Plan Note (Signed)
Mild Discussed cigarette cessation--not ready No Rx needed Offered lung cancer screening--she will consider

## 2016-10-29 NOTE — Assessment & Plan Note (Signed)
Back pain controlled with the hydrocodone (so she is functional) No Rx on CSRS other than here Has had UDS

## 2016-10-29 NOTE — Patient Instructions (Signed)
Please let me know if you want to look into the lung cancer screening program.

## 2016-11-12 ENCOUNTER — Other Ambulatory Visit: Payer: Self-pay | Admitting: Internal Medicine

## 2016-11-15 ENCOUNTER — Encounter: Payer: Self-pay | Admitting: Internal Medicine

## 2016-11-16 MED ORDER — HYDROCODONE-ACETAMINOPHEN 10-325 MG PO TABS: 1.0000 | ORAL_TABLET | Freq: Two times a day (BID) | ORAL | 0 refills | Status: DC | PRN

## 2016-11-16 NOTE — Telephone Encounter (Signed)
Last printed 10-14-16 #60 Last OV 10-29-16 with CSRS Next OV 02-03-17 Last UDS 06-17-16

## 2016-12-07 ENCOUNTER — Other Ambulatory Visit: Payer: Self-pay | Admitting: Internal Medicine

## 2016-12-07 NOTE — Telephone Encounter (Signed)
Approved: #30 x 3 

## 2016-12-15 ENCOUNTER — Encounter: Payer: Self-pay | Admitting: Internal Medicine

## 2016-12-16 MED ORDER — HYDROCODONE-ACETAMINOPHEN 10-325 MG PO TABS: 1.0000 | ORAL_TABLET | Freq: Two times a day (BID) | ORAL | 0 refills | Status: DC | PRN

## 2016-12-16 NOTE — Telephone Encounter (Signed)
Hydrocodone last printed 11-16-16 #60 Last OV 10-29-16 with CSRS done. Next OV 02-03-17 UDS/CSA 06-17-16

## 2016-12-22 ENCOUNTER — Encounter: Payer: Self-pay | Admitting: Internal Medicine

## 2016-12-23 ENCOUNTER — Encounter: Payer: Self-pay | Admitting: Internal Medicine

## 2016-12-23 DIAGNOSIS — Z7689 Persons encountering health services in other specified circumstances: Secondary | ICD-10-CM

## 2016-12-23 NOTE — Telephone Encounter (Signed)
Can you make these arrangements for her. I am okay with mailing it as needed Please let her know

## 2017-01-03 ENCOUNTER — Encounter: Payer: Self-pay | Admitting: Internal Medicine

## 2017-01-11 ENCOUNTER — Encounter: Payer: Self-pay | Admitting: Internal Medicine

## 2017-01-11 NOTE — Telephone Encounter (Signed)
Last written 12-16-16. Last OV 10-29-16 Next OV 02-03-17

## 2017-01-12 MED ORDER — HYDROCODONE-ACETAMINOPHEN 10-325 MG PO TABS: 1.0000 | ORAL_TABLET | Freq: Two times a day (BID) | ORAL | 0 refills | Status: DC | PRN

## 2017-01-12 NOTE — Telephone Encounter (Addendum)
Sean with [facility] [place] [facility] left v/m to verify OK to fill Hydrocodone apap ;  [facility] Policy is to fill 2 days early but this is 4 days early. Sean request  Cb.

## 2017-01-27 ENCOUNTER — Ambulatory Visit: Payer: Self-pay | Admitting: Internal Medicine

## 2017-02-03 ENCOUNTER — Ambulatory Visit: Payer: BLUE CROSS/BLUE SHIELD | Admitting: Internal Medicine

## 2017-02-14 ENCOUNTER — Ambulatory Visit (INDEPENDENT_AMBULATORY_CARE_PROVIDER_SITE_OTHER): Payer: BLUE CROSS/BLUE SHIELD | Admitting: Internal Medicine

## 2017-02-14 ENCOUNTER — Encounter: Payer: Self-pay | Admitting: Internal Medicine

## 2017-02-14 VITALS — BP 104/70 | HR 75 | Temp 97.8°F | Wt 141.0 lb

## 2017-02-14 DIAGNOSIS — F1120 Opioid dependence, uncomplicated: Secondary | ICD-10-CM

## 2017-02-14 DIAGNOSIS — Z23 Encounter for immunization: Secondary | ICD-10-CM

## 2017-02-14 DIAGNOSIS — M5442 Lumbago with sciatica, left side: Secondary | ICD-10-CM

## 2017-02-14 DIAGNOSIS — G8929 Other chronic pain: Secondary | ICD-10-CM | POA: Diagnosis not present

## 2017-02-14 DIAGNOSIS — M5441 Lumbago with sciatica, right side: Secondary | ICD-10-CM | POA: Diagnosis not present

## 2017-02-14 MED ORDER — HYDROCODONE-ACETAMINOPHEN 10-325 MG PO TABS: 1.0000 | ORAL_TABLET | Freq: Two times a day (BID) | ORAL | 0 refills | Status: DC | PRN
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
## 2017-02-14 NOTE — Assessment & Plan Note (Signed)
Checked state CSRS---no Rx other than here No apparent other issues Will continue Discussed--will give 3 Rx

## 2017-02-14 NOTE — Addendum Note (Signed)
Addended by: Eual FinesBRIDGES, Fabrice Dyal P on: 02/14/2017 05:40 PM   Modules accepted: Orders

## 2017-02-14 NOTE — Assessment & Plan Note (Signed)
Ongoing pain but no functional decline Continues on the hydrocodone

## 2017-03-30 ENCOUNTER — Other Ambulatory Visit: Payer: Self-pay | Admitting: Internal Medicine

## 2017-04-29 ENCOUNTER — Other Ambulatory Visit: Payer: Self-pay | Admitting: Internal Medicine

## 2017-04-29 DIAGNOSIS — Z1231 Encounter for screening mammogram for malignant neoplasm of breast: Secondary | ICD-10-CM

## 2017-05-18 ENCOUNTER — Ambulatory Visit (INDEPENDENT_AMBULATORY_CARE_PROVIDER_SITE_OTHER): Payer: BLUE CROSS/BLUE SHIELD | Admitting: Internal Medicine

## 2017-05-18 ENCOUNTER — Encounter: Payer: Self-pay | Admitting: Internal Medicine

## 2017-05-18 VITALS — BP 98/80 | HR 86 | Temp 97.9°F | Wt 140.0 lb

## 2017-05-18 DIAGNOSIS — F1120 Opioid dependence, uncomplicated: Secondary | ICD-10-CM | POA: Diagnosis not present

## 2017-05-18 DIAGNOSIS — R1013 Epigastric pain: Secondary | ICD-10-CM | POA: Diagnosis not present

## 2017-05-18 DIAGNOSIS — G8929 Other chronic pain: Secondary | ICD-10-CM

## 2017-05-18 DIAGNOSIS — M545 Low back pain: Secondary | ICD-10-CM | POA: Diagnosis not present

## 2017-05-18 MED ORDER — HYDROCODONE-ACETAMINOPHEN 10-325 MG PO TABS: 1.0000 | ORAL_TABLET | Freq: Two times a day (BID) | ORAL | 0 refills | Status: DC | PRN

## 2017-05-18 MED ORDER — OMEPRAZOLE 40 MG PO CPDR: 40.0000 mg | DELAYED_RELEASE_CAPSULE | Freq: Every day | ORAL | 3 refills | Status: DC

## 2017-05-18 NOTE — Progress Notes (Signed)
Subjective:    Patient ID: Catherine Griffin, female    DOB: September 15, 1955, 62 y.o.   MRN: [id number]  HPI Here for follow up of chronic back pain and narcotic dependence  Having some epigastric pain No radiation now Has cut back on her ibuprofen--only if pain really bad No blood in stool or black stool Takes OTC prilosec daily--no clear help  Same back pain Uses hydrocodone bid and flexeril at night  Current Outpatient Medications on File Prior to Visit  Medication Sig Dispense Refill  . cholecalciferol (VITAMIN D) 1000 UNITS tablet Take 1,000 Units by mouth daily.    . CYANOCOBALAMIN PO Take 1 tablet by mouth daily.    . cyclobenzaprine (FLEXERIL) 10 MG tablet 1/2 TO 1 TABLET BY MOUTH AT BEDTIME AS NEEDED. 30 tablet 2  . ECHINACEA HERB PO Take 1 tablet by mouth daily.    Marland Kitchen. HYDROcodone-acetaminophen (NORCO) 10-325 MG tablet Take 1 tablet 2 (two) times daily as needed by mouth. 60 tablet 0  . ibuprofen (ADVIL,MOTRIN) 200 MG tablet Take 400 mg by mouth 3 (three) times daily.    . Melatonin 5 MG TABS Take 1 tablet by mouth at bedtime.     No current facility-administered medications on file prior to visit.     Allergies  Allergen Reactions  . Codeine Phosphate [Codeine] Nausea Only  . Indomethacin     REACTION: Rash/hives/swelling    Past Medical History:  Diagnosis Date  . Abdominal pain   . Abdominal pain, right upper quadrant   . Actinic keratosis    04/25/07  . Aphthous ulcer of mouth   . Arthritis   . Back pain    04/25/07  . Chest pain    04/25/07  . Diverticulosis of colon    02/28/09  . Fatigue    05/30/08  . Hemorrhoid    12/01/10  . Hepatitis C   . Hyperlipidemia   . Hypothyroidism   . Lichen sclerosus   . Osteopenia   . Osteoporosis    04/25/07  . RUQ abdominal pain    02/07/09  . Thyroid disease    hypothyrodism  . Vaginitis    10/01/10    Past Surgical History:  Procedure Laterality Date  . ABDOMINAL HYSTERECTOMY      Family History    Problem Relation Age of Onset  . Heart disease Father   . Arthritis Father   . Other Father        joint replacement  . Cancer Mother        lung  . Anemia Sister   . Other Sister        CHF  . Breast cancer Sister [id number]  . Cancer Sister        1 sister died of metastatic cancer  . Cancer Brother        lung cancer  . Coronary artery disease Neg Hx   . Diabetes Neg Hx     Social History   Socioeconomic History  . Marital status: Married    Spouse name: Not on file  . Number of children: Not on file  . Years of education: Not on file  . Highest education level: Not on file  Social Needs  . Financial resource strain: Not on file  . Food insecurity - worry: Not on file  . Food insecurity - inability: Not on file  . Transportation needs - medical: Not on file  . Transportation needs - non-medical: Not on file  Occupational History  . 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 Smoking status: Former Smoker    Types: Cigarettes    Last attempt to quit: 04/27/2017    Years since quitting: 0.0  . Smokeless tobacco: Never Used  Substance and Sexual Activity  . Alcohol use: No    Alcohol/week: 0.0 oz  . Drug use: No  . Sexual activity: Not on file  Other Topics Concern  . Not on file  Social History Narrative   2 step children   Review of Systems  Appetite is good No weight loss     Objective:   Physical Exam  Constitutional: She appears well-developed. No distress.  Abdominal: Soft. She exhibits no distension. There is no rebound and no guarding.  Mild epigastric tenderness          Assessment & Plan:

## 2017-05-18 NOTE — Assessment & Plan Note (Signed)
CSRS checked Will redo contract and check UDS

## 2017-05-18 NOTE — Assessment & Plan Note (Signed)
Suggestive of acid related problem Will increase PPI---needs to take fasting GI evaluation---also due for colon soon

## 2017-05-18 NOTE — Assessment & Plan Note (Signed)
Stable on Rx.

## 2017-05-21 LAB — PAIN MGMT, PROFILE 8 W/CONF, U
6 Acetylmorphine: NEGATIVE ng/mL (ref <10)
ALCOHOL METABOLITES: NEGATIVE ng/mL (ref <500)
Amphetamines: NEGATIVE ng/mL (ref <500)
BENZODIAZEPINES: NEGATIVE ng/mL (ref <100)
BUPRENORPHINE, URINE: NEGATIVE ng/mL (ref <5)
COCAINE METABOLITE: NEGATIVE ng/mL (ref <150)
CODEINE: NEGATIVE ng/mL (ref <50)
CREATININE: 24 mg/dL
Hydrocodone: 438 ng/mL — ABNORMAL HIGH (ref <50)
Hydromorphone: 203 ng/mL — ABNORMAL HIGH (ref <50)
MDMA: NEGATIVE ng/mL (ref <500)
MORPHINE: NEGATIVE ng/mL (ref <50)
Marijuana Metabolite: NEGATIVE ng/mL (ref <20)
NORHYDROCODONE: 724 ng/mL — AB (ref <50)
OPIATES: POSITIVE ng/mL — AB (ref <100)
OXYCODONE: NEGATIVE ng/mL (ref <100)
Oxidant: NEGATIVE ug/mL (ref <200)
PH: 5.91 (ref 4.5–9.0)

## 2017-05-26 ENCOUNTER — Other Ambulatory Visit: Payer: Self-pay | Admitting: Internal Medicine

## 2017-05-26 NOTE — Telephone Encounter (Signed)
Approved: #30 x 3 

## 2017-06-13 ENCOUNTER — Encounter: Payer: Self-pay | Admitting: Internal Medicine

## 2017-06-13 ENCOUNTER — Other Ambulatory Visit: Payer: Self-pay | Admitting: Internal Medicine

## 2017-06-13 ENCOUNTER — Ambulatory Visit
Admission: RE | Admit: 2017-06-13 | Discharge: 2017-06-13 | Disposition: A | Discharge status: Home / Self Care | Payer: BLUE CROSS/BLUE SHIELD | Source: Ambulatory Visit | Attending: Internal Medicine | Admitting: Internal Medicine

## 2017-06-13 DIAGNOSIS — Z1231 Encounter for screening mammogram for malignant neoplasm of breast: Secondary | ICD-10-CM

## 2017-06-13 IMAGING — MG MM DIGITAL SCREENING BILAT W/ TOMO W/ CAD
8 of 12 series · 8 of 28 positions shown · non-contrast
Comparison: Previous exam(s).

CLINICAL DATA: Screening.

EXAM:
DIGITAL SCREENING BILATERAL MAMMOGRAM WITH TOMO AND CAD

[R CC]
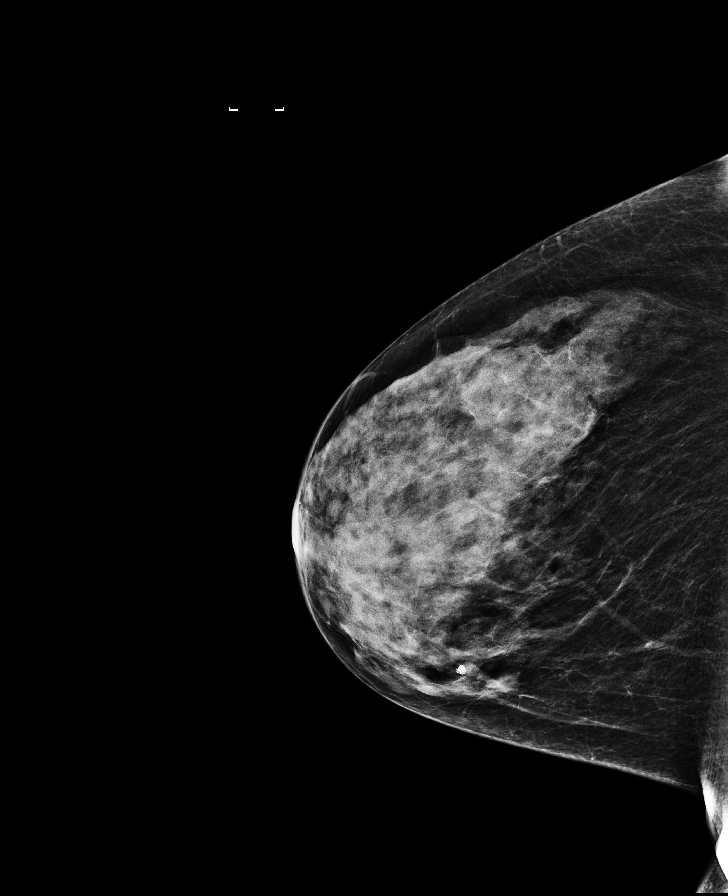

[L CC synth-2D]
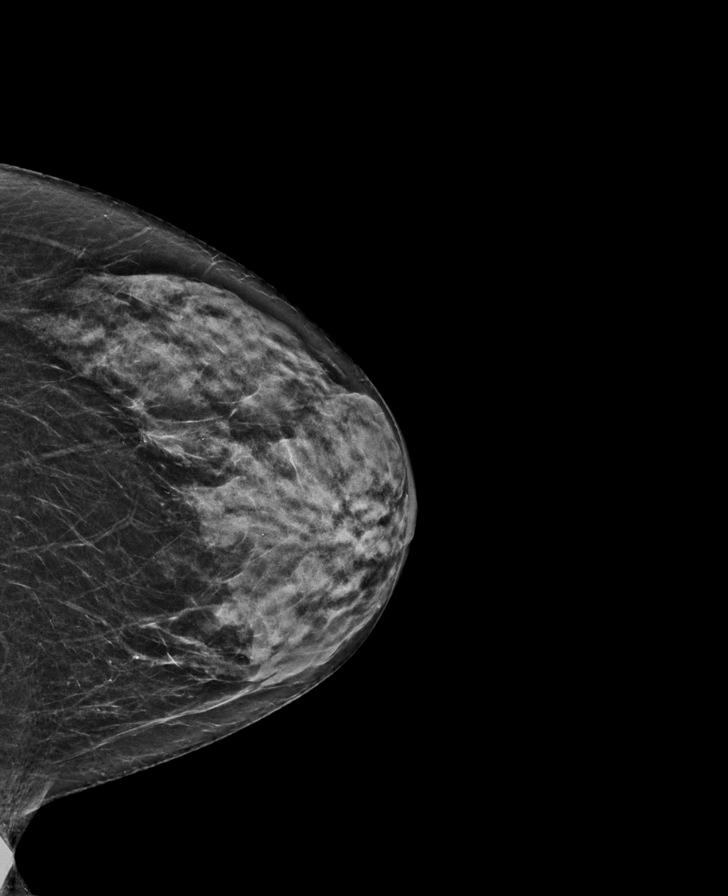

[L MLO synth-2D]
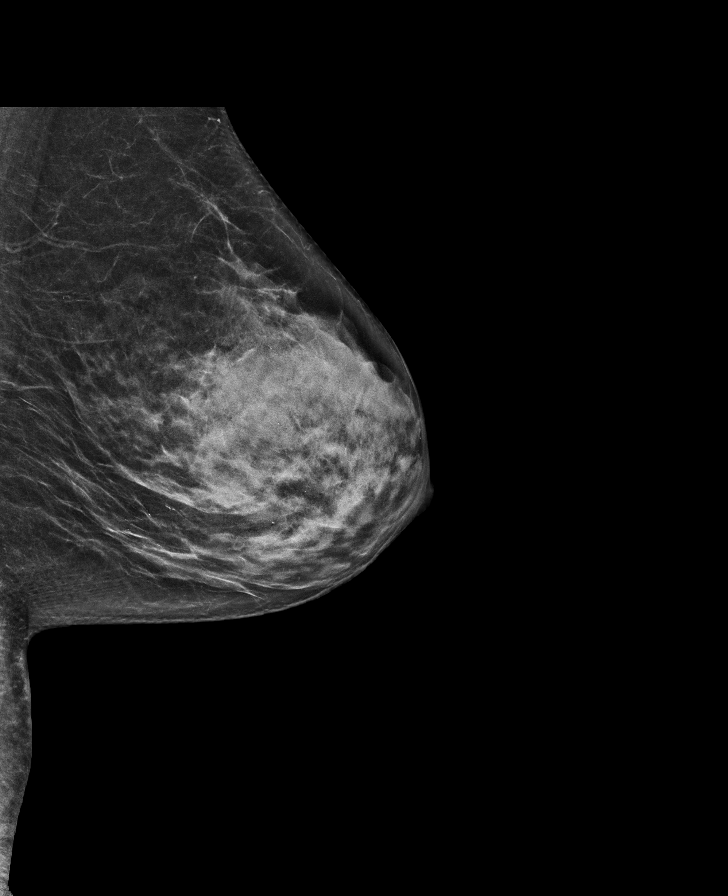

[L CC]
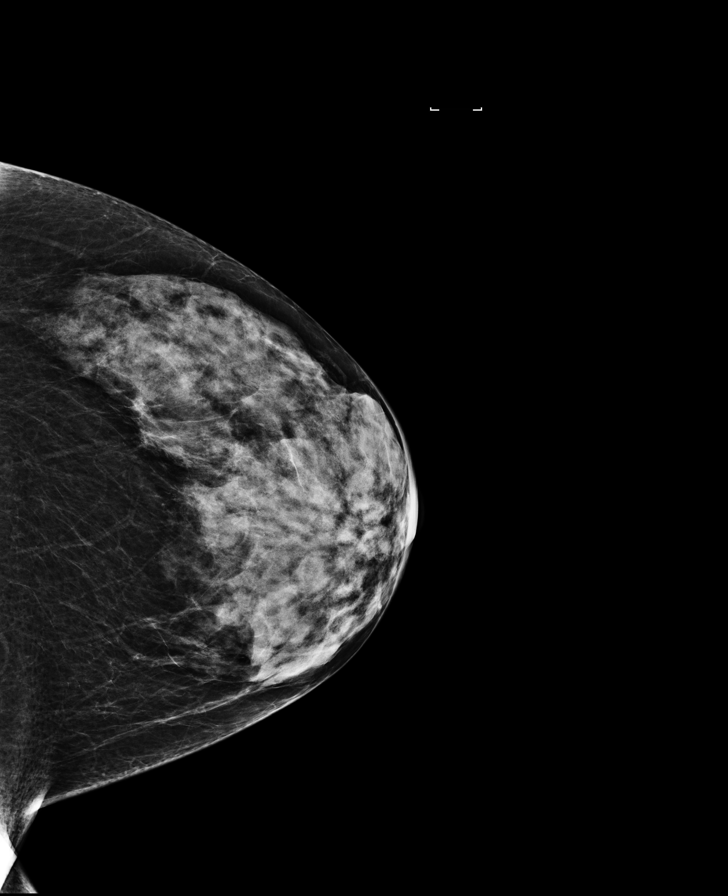

[R MLO synth-2D]
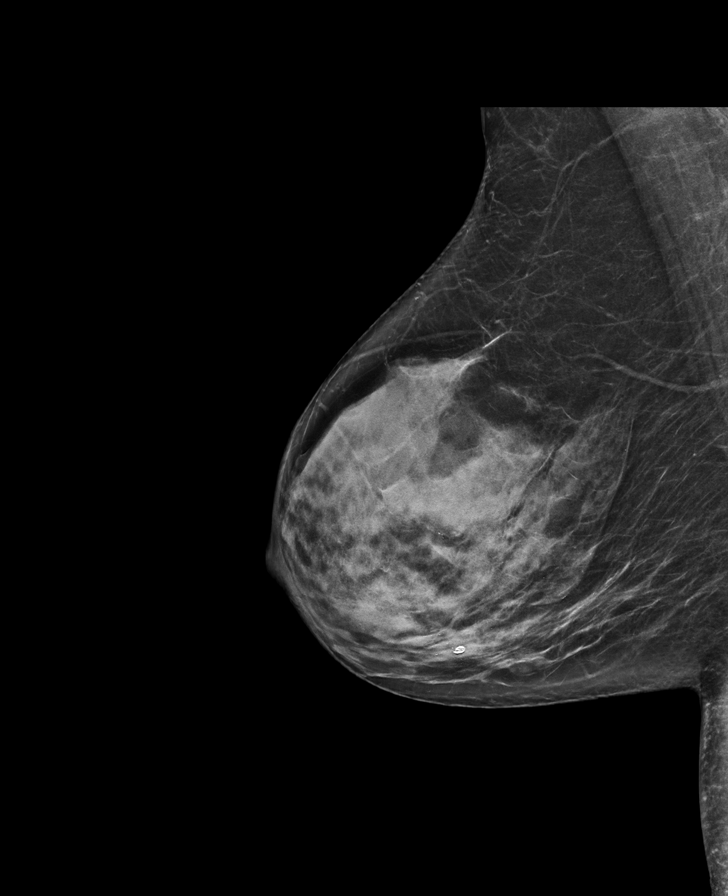

[R MLO]
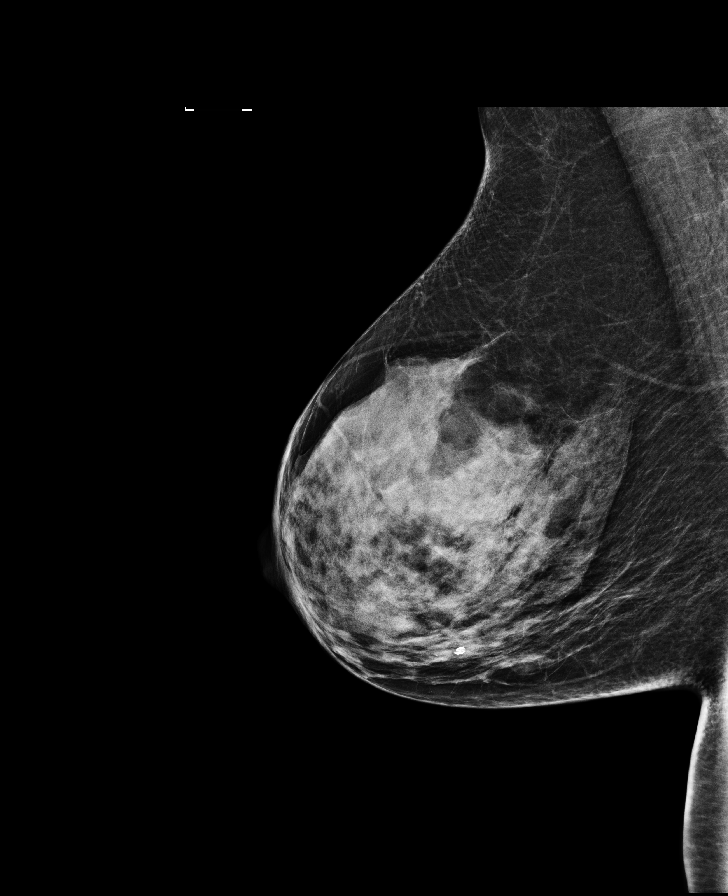

[R CC synth-2D]
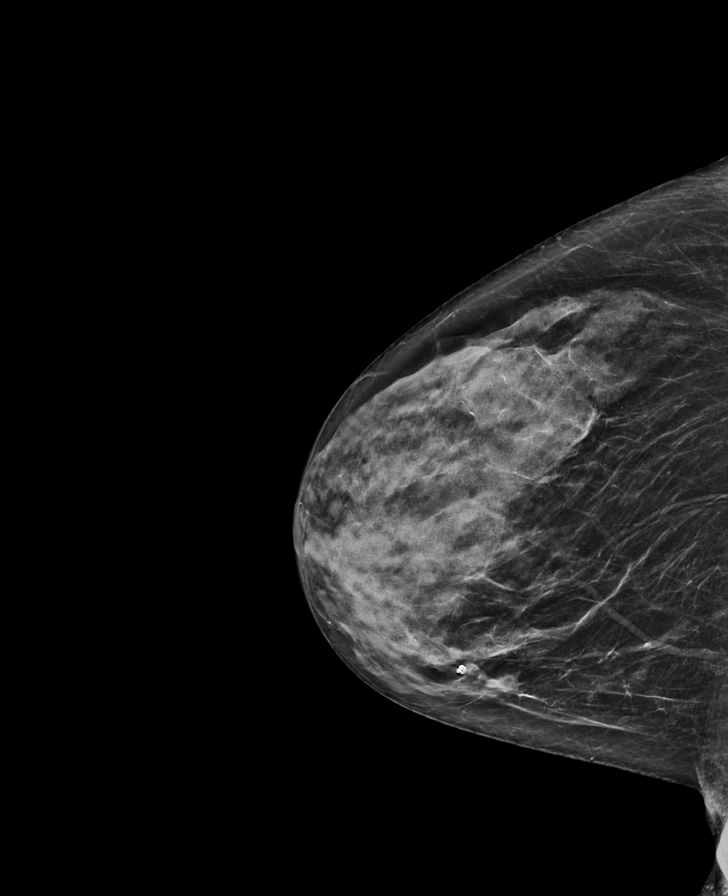

[L MLO]
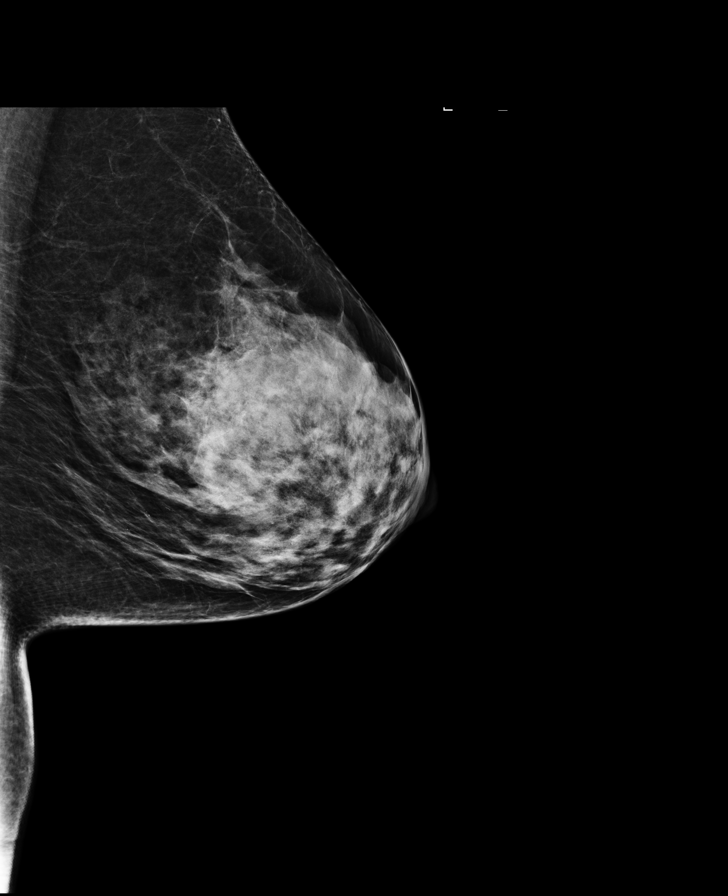

[8 of 28 positions shown; findings below may reference images not displayed]

ACR Breast Density Category c: The breast tissue is heterogeneously
dense, which may obscure small masses.
FINDINGS: There are no findings suspicious for malignancy. Images were
processed with CAD.
IMPRESSION: No mammographic evidence of malignancy. A result letter of this
screening mammogram will be mailed directly to the patient.

RECOMMENDATION:
Screening mammogram in one year. (Code:[5V])

BI-RADS CATEGORY  1: Negative.

## 2017-06-14 NOTE — Telephone Encounter (Signed)
Last filled 05-18-17 #60 Last OV 05-18-17 Next OV 08-22-17 Last UDS/CSA 06-17-16. Will update in May  Forward to Dr Reece AgarG in Dr Karle StarchLetvak's absence

## 2017-06-15 ENCOUNTER — Encounter: Payer: Self-pay | Admitting: Gastroenterology

## 2017-06-15 ENCOUNTER — Other Ambulatory Visit: Payer: Self-pay

## 2017-06-15 ENCOUNTER — Ambulatory Visit (INDEPENDENT_AMBULATORY_CARE_PROVIDER_SITE_OTHER): Payer: BLUE CROSS/BLUE SHIELD | Admitting: Gastroenterology

## 2017-06-15 VITALS — BP 118/74 | HR 81 | Ht 62.25 in | Wt 142.6 lb

## 2017-06-15 DIAGNOSIS — Z1211 Encounter for screening for malignant neoplasm of colon: Secondary | ICD-10-CM | POA: Diagnosis not present

## 2017-06-15 DIAGNOSIS — R109 Unspecified abdominal pain: Secondary | ICD-10-CM

## 2017-06-15 MED ORDER — BISACODYL 5 MG PO TBEC: 10.0000 mg | DELAYED_RELEASE_TABLET | Freq: Once | ORAL | 0 refills | Status: AC

## 2017-06-15 MED ORDER — PEG 3350-KCL-NA BICARB-NACL 420 G PO SOLR: 4000.0000 mL | Freq: Once | ORAL | 0 refills | Status: AC

## 2017-06-15 MED ORDER — PEG 3350-KCL-NA BICARB-NACL 420 G PO SOLR: 4000.0000 mL | Freq: Once | ORAL | 0 refills | Status: DC

## 2017-06-15 NOTE — Patient Instructions (Signed)
Take Miralax every day for one week prior to procedure. If not having a bowel movement every day or every other day, contact office.   High-Fiber Diet Fiber, also called dietary fiber, is a type of carbohydrate found in fruits, vegetables, whole grains, and beans. A high-fiber diet can have many health benefits. Your health care provider may recommend a high-fiber diet to help:  Prevent constipation. Fiber can make your bowel movements more regular.  Lower your cholesterol.  Relieve hemorrhoids, uncomplicated diverticulosis, or irritable bowel syndrome.  Prevent overeating as part of a weight-loss plan.  Prevent heart disease, type 2 diabetes, and certain cancers.  What is my plan? The recommended daily intake of fiber includes:  38 grams for men under age 64.  30 grams for men over age 64.  25 grams for women under age 64.  21 grams for women over age 64.  You can get the recommended daily intake of dietary fiber by eating a variety of fruits, vegetables, grains, and beans. Your health care provider may also recommend a fiber supplement if it is not possible to get enough fiber through your diet. What do I need to know about a high-fiber diet?  Fiber supplements have not been widely studied for their effectiveness, so it is better to get fiber through food sources.  Always check the fiber content on thenutrition facts label of any prepackaged food. Look for foods that contain at least 5 grams of fiber per serving.  Ask your dietitian if you have questions about specific foods that are related to your condition, especially if those foods are not listed in the following section.  Increase your daily fiber consumption gradually. Increasing your intake of dietary fiber too quickly may cause bloating, cramping, or gas.  Drink plenty of water. Water helps you to digest fiber. What foods can I eat? Grains Whole-grain breads. Multigrain cereal. Oats and oatmeal. Brown rice.  Barley. Bulgur wheat. Millet. Bran muffins. Popcorn. Rye wafer crackers. Vegetables Sweet potatoes. Spinach. Kale. Artichokes. Cabbage. Broccoli. Green peas. Carrots. Squash. Fruits Berries. Pears. Apples. Oranges. Avocados. Prunes and raisins. Dried figs. Meats and Other Protein Sources Navy, kidney, pinto, and soy beans. Split peas. Lentils. Nuts and seeds. Dairy Fiber-fortified yogurt. Beverages Fiber-fortified soy milk. Fiber-fortified orange juice. Other Fiber bars. The items listed above may not be a complete list of recommended foods or beverages. Contact your dietitian for more options. What foods are not recommended? Grains White bread. Pasta made with refined flour. White rice. Vegetables Fried potatoes. Canned vegetables. Well-cooked vegetables. Fruits Fruit juice. Cooked, strained fruit. Meats and Other Protein Sources Fatty cuts of meat. Fried Environmental education officerpoultry or fried fish. Dairy Milk. Yogurt. Cream cheese. Sour cream. Beverages Soft drinks. Other Cakes and pastries. Butter and oils. The items listed above may not be a complete list of foods and beverages to avoid. Contact your dietitian for more information. What are some tips for including high-fiber foods in my diet?  Eat a wide variety of high-fiber foods.  Make sure that half of all grains consumed each day are whole grains.  Replace breads and cereals made from refined flour or white flour with whole-grain breads and cereals.  Replace white rice with brown rice, bulgur wheat, or millet.  Start the day with a breakfast that is high in fiber, such as a cereal that contains at least 5 grams of fiber per serving.  Use beans in place of meat in soups, salads, or pasta.  Eat high-fiber snacks, such as berries,  raw vegetables, nuts, or popcorn. This information is not intended to replace advice given to you by your health care provider. Make sure you discuss any questions you have with your health care  provider. Document Released: 03/29/2005 Document Revised: 09/04/2015 Document Reviewed: 09/11/2013 Elsevier Interactive Patient Education  [employer].

## 2017-06-16 ENCOUNTER — Encounter: Payer: Self-pay | Admitting: *Deleted

## 2017-06-16 ENCOUNTER — Other Ambulatory Visit: Payer: Self-pay

## 2017-06-16 ENCOUNTER — Encounter: Payer: Self-pay | Admitting: Internal Medicine

## 2017-06-16 MED ORDER — HYDROCODONE-ACETAMINOPHEN 10-325 MG PO TABS: 1.0000 | ORAL_TABLET | Freq: Two times a day (BID) | ORAL | 0 refills | Status: DC | PRN

## 2017-06-16 NOTE — Progress Notes (Signed)
Catherine Griffin [address]  [address]  [place], [place] [address]  Main: [phone]  Fax: [phone]   Gastroenterology Consultation  Referring Provider:     Karie Schwalbe, MD Primary Care Physician:  Karie Schwalbe, MD Primary Gastroenterologist:  Dr. Melodie Griffin Reason for Consultation:     Epigastric abdominal pain        HPI:   Catherine Griffin is a 62 y.o. y/o female referred for consultation & management  by Dr. Karie Schwalbe, MD.  Patient reports a history of intermittent epigastric pain, cramping, 5/10, with no radiation, with no nausea vomiting, no dysphagia.  Reports heartburn despite PPI.  Patient has never had an EGD.  Last colonoscopy was in November 2009 for screening, one 7 mm sigmoid colon polyp was removed via cold snare.  Internal hemorrhoids and diverticulosis was reported.  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  Pathology showed hyperplastic polyp.  Letter sent to patient stated that patient needs repeat colonoscopy in 7 years.  A recall letter was sent out to patient in 2017, but repeat colonoscopy was not scheduled at that time.    Past Medical History:  Diagnosis Date  . Abdominal pain   . Abdominal pain, right upper quadrant   . Actinic keratosis    04/25/07  . Aphthous ulcer of mouth   . Arthritis   . Back pain    04/25/07  . Chest pain    04/25/07  . Diverticulosis of colon    02/28/09  . Fatigue    05/30/08  . Hemorrhoid    12/01/10  . Hepatitis C   . Hyperlipidemia   . Hypothyroidism   . Lichen sclerosus   . Osteopenia   . Osteoporosis    04/25/07  . RUQ abdominal pain    02/07/09  . Spinal stenosis   . Thyroid disease    hypothyrodism  . Vaginitis    10/01/10  . Wears dentures    partial upper and lower    Past Surgical History:  Procedure Laterality Date  . ABDOMINAL HYSTERECTOMY    . TONSILLECTOMY      Prior to Admission medications   Medication Sig Start Date End Date  Taking? Authorizing Provider  cholecalciferol (VITAMIN D) 1000 UNITS tablet Take 1,000 Units by mouth daily.   Yes [provider]  CYANOCOBALAMIN PO Take 1 tablet by mouth daily.   Yes [provider]  cyclobenzaprine (FLEXERIL) 10 MG tablet 1/2 TO 1 TABLET BY MOUTH AT BEDTIME AS NEEDED. 05/26/17  Yes Karie Schwalbe, MD  ECHINACEA HERB PO Take 1 tablet by mouth daily.   Yes [provider]  ibuprofen (ADVIL,MOTRIN) 200 MG tablet Take 400 mg by mouth 3 (three) times daily.   Yes [provider]  Melatonin 5 MG TABS Take 1 tablet by mouth at bedtime.   Yes [provider]  omeprazole (PRILOSEC) 40 MG capsule Take 1 capsule (40 mg total) by mouth daily. Patient not taking: Reported on 06/16/2017 05/18/17  Yes Karie Schwalbe, MD  aspirin 81 MG tablet Take 81 mg by mouth daily.    [provider]  HYDROcodone-acetaminophen (NORCO) 10-325 MG tablet Take 1 tablet by mouth 2 (two) times daily as needed. 06/16/17   Eustaquio Boyden, MD    Family History  Problem Relation Age of Onset  . Heart disease Father   . Arthritis Father   . Other Father        joint  replacement  . Cancer Mother        lung  . Anemia Sister   . Other Sister        CHF  . Breast cancer Sister 85  . Cancer Sister        1 sister died of metastatic cancer  . Cancer Brother        lung cancer  . Coronary artery disease Neg Hx   . Diabetes Neg Hx      Social History   Tobacco Use  . Smoking status: Current Every Day Smoker    Packs/day: 1.00    Types: Cigarettes    Last attempt to quit: 04/27/2017    Years since quitting: 0.1  . Smokeless tobacco: Never Used  . Tobacco comment: since age 27  Substance Use Topics  . Alcohol use: No    Alcohol/week: 0.0 oz  . Drug use: No    Allergies as of 06/15/2017 - Review Complete 06/15/2017  Allergen Reaction Noted  . Codeine phosphate [codeine] Nausea Only 07/13/2014  . Indomethacin  04/25/2007    Review of  Systems:    All systems reviewed and negative except where noted in HPI.   Physical Exam:  BP 118/74   Pulse 81   Ht 5' 2.25" (1.581 m)   Wt 142 lb 9.6 oz (64.7 kg)   BMI 25.87 kg/m  No LMP recorded. Patient has had a hysterectomy. Psych:  Alert and cooperative. Normal mood and affect. General:   Alert,  Well-developed, well-nourished, pleasant and cooperative in NAD Head:  Normocephalic and atraumatic. Eyes:  Sclera clear, no icterus.   Conjunctiva pink. Ears:  Normal auditory acuity. Nose:  No deformity, discharge, or lesions. Mouth:  No deformity or lesions,oropharynx pink & moist. Neck:  Supple; no masses or thyromegaly. Lungs:  Respirations even and unlabored.  Clear throughout to auscultation.   No wheezes, crackles, or rhonchi. No acute distress. Heart:  Regular rate and rhythm; no murmurs, clicks, rubs, or gallops. Abdomen:  Normal bowel sounds.  No bruits.  Soft, non-tender and non-distended without masses, hepatosplenomegaly or hernias noted.  No guarding or rebound tenderness.    Msk:  Symmetrical without gross deformities. Good, equal movement & strength bilaterally. Pulses:  Normal pulses noted. Extremities:  No clubbing or edema.  No cyanosis. Neurologic:  Alert and oriented x3;  grossly normal neurologically. Skin:  Intact without significant lesions or rashes. No jaundice. Lymph Nodes:  No significant cervical adenopathy. Psych:  Alert and cooperative. Normal mood and affect.   Labs: CBC    Component Value Date/Time   WBC 7.1 10/29/2016 1209   RBC 4.47 10/29/2016 1209   HGB 14.2 10/29/2016 1209   HCT 42.1 10/29/2016 1209   PLT 221.0 10/29/2016 1209   MCV 94.1 10/29/2016 1209   MCHC 33.7 10/29/2016 1209   RDW 14.2 10/29/2016 1209   LYMPHSABS 2.8 10/29/2016 1209   MONOABS 0.6 10/29/2016 1209   EOSABS 0.2 10/29/2016 1209   BASOSABS 0.0 10/29/2016 1209   CMP     Component Value Date/Time   NA 139 10/29/2016 1209   K 3.8 10/29/2016 1209   CL 103  10/29/2016 1209   CO2 29 10/29/2016 1209   GLUCOSE 95 10/29/2016 1209   BUN 21 10/29/2016 1209   CREATININE 0.90 10/29/2016 1209   CALCIUM 9.5 10/29/2016 1209   PROT 6.9 10/29/2016 1209   ALBUMIN 3.9 10/29/2016 1209   AST 19 10/29/2016 1209   ALT 10 10/29/2016 1209   ALKPHOS  61 10/29/2016 1209   BILITOT 0.2 10/29/2016 1209   GFRNONAA 79.00 02/07/2009 1220   GFRAA 96 05/30/2008 0000    Imaging Studies: Mm Screening Breast Tomo Bilateral  Result Date: 06/13/2017 CLINICAL DATA:  Screening. EXAM: DIGITAL SCREENING BILATERAL MAMMOGRAM WITH TOMO AND CAD COMPARISON:  Previous exam(s). ACR Breast Density Category c: The breast tissue is heterogeneously dense, which may obscure small masses. FINDINGS: There are no findings suspicious for malignancy. Images were processed with CAD. IMPRESSION: No mammographic evidence of malignancy. A result letter of this screening mammogram will be mailed directly to the patient. RECOMMENDATION: Screening mammogram in one year. (Code:SM-B-01Y) BI-RADS CATEGORY  1: Negative. Electronically Signed   By: Ted Mcalpineobrinka  Dimitrova M.D.   On: 06/13/2017 12:25    Assessment and Plan:   Georjean ModeMarie Skenes Durnin is a 62 y.o. y/o female has been referred for epigastric abdominal pain, and need for colon cancer screening  Due to abdominal pain, and heartburn, despite PPI, will schedule for EGD for evaluation and to Rule out Barrett's will obtain gastric biopsies for H. pylori at the time Patient educated extensively on acid reflux lifestyle modification, including buying a bed wedge, not eating 3 hrs before bedtime.  Can discontinue or change PPI depending on EGD findings (Risks of PPI use were discussed with patient including bone loss, C. Diff diarrhea, pneumonia, infections, CKD, electrolyte abnormalities.  If clinically possible based on symptoms, goal would be to maintain patient on the lowest dose possible, or discontinue the medication with institution of acid reflux  lifestyle modifications over time. Pt. Verbalizes understanding and chooses to continue the medication.)   Her sister had metastatic cancer with unknown primary Her last colonoscopy was November 2009, and repeat was recommended in 7 years Is now due for colonoscopy screening We will schedule it along with the EGD   I have discussed alternative options, risks & benefits,  which include, but are not limited to, bleeding, infection, perforation,respiratory complication & drug reaction.  The patient agrees with this plan & written consent will be obtained.      Dr Catherine BouillonVarnita Rozena Fierro

## 2017-06-16 NOTE — Telephone Encounter (Signed)
E prescibed PCP reviewed [place] CSRS 05/2017

## 2017-06-20 NOTE — Discharge Instructions (Signed)
General Anesthesia, Adult, Care After °These instructions provide you with information about caring for yourself after your procedure. Your health care provider may also give you more specific instructions. Your treatment has been planned according to current medical practices, but problems sometimes occur. Call your health care provider if you have any problems or questions after your procedure. °What can I expect after the procedure? °After the procedure, it is common to have: °· Vomiting. °· A sore throat. °· Mental slowness. ° °It is common to feel: °· Nauseous. °· Cold or shivery. °· Sleepy. °· Tired. °· Sore or achy, even in parts of your body where you did not have surgery. ° °Follow these instructions at home: °For at least 24 hours after the procedure: °· Do not: °? Participate in activities where you could fall or become injured. °? Drive. °? Use heavy machinery. °? Drink alcohol. °? Take sleeping pills or medicines that cause drowsiness. °? Make important decisions or sign legal documents. °? Take care of children on your own. °· Rest. °Eating and drinking °· If you vomit, drink water, juice, or soup when you can drink without vomiting. °· Drink enough fluid to keep your urine clear or pale yellow. °· Make sure you have little or no nausea before eating solid foods. °· Follow the diet recommended by your health care provider. °General instructions °· Have a responsible adult stay with you until you are awake and alert. °· Return to your normal activities as told by your health care provider. Ask your health care provider what activities are safe for you. °· Take over-the-counter and prescription medicines only as told by your health care provider. °· If you smoke, do not smoke without supervision. °· Keep all follow-up visits as told by your health care provider. This is important. °Contact a health care provider if: °· You continue to have nausea or vomiting at home, and medicines are not helpful. °· You  cannot drink fluids or start eating again. °· You cannot urinate after 8-12 hours. °· You develop a skin rash. °· You have fever. °· You have increasing redness at the site of your procedure. °Get help right away if: °· You have difficulty breathing. °· You have chest pain. °· You have unexpected bleeding. °· You feel that you are having a life-threatening or urgent problem. °This information is not intended to replace advice given to you by your health care provider. Make sure you discuss any questions you have with your health care provider. °Document Released: 07/05/2000 Document Revised: 09/01/2015 Document Reviewed: 03/13/2015 °Elsevier Interactive Patient Education © 2018 [employer] ° °

## 2017-06-21 ENCOUNTER — Ambulatory Visit
Admission: RE | Admit: 2017-06-21 | Discharge: 2017-06-21 | Disposition: A | Discharge status: Home / Self Care | Payer: BLUE CROSS/BLUE SHIELD | Source: Ambulatory Visit | Attending: Gastroenterology | Admitting: Gastroenterology

## 2017-06-21 ENCOUNTER — Encounter
Admission: RE | Disposition: A | Discharge status: Home / Self Care | Payer: Self-pay | Source: Ambulatory Visit | Attending: Gastroenterology

## 2017-06-21 ENCOUNTER — Ambulatory Visit: Payer: BLUE CROSS/BLUE SHIELD | Admitting: Anesthesiology

## 2017-06-21 DIAGNOSIS — L900 Lichen sclerosus et atrophicus: Secondary | ICD-10-CM | POA: Diagnosis not present

## 2017-06-21 DIAGNOSIS — E785 Hyperlipidemia, unspecified: Secondary | ICD-10-CM | POA: Diagnosis not present

## 2017-06-21 DIAGNOSIS — R12 Heartburn: Secondary | ICD-10-CM | POA: Diagnosis not present

## 2017-06-21 DIAGNOSIS — Z1211 Encounter for screening for malignant neoplasm of colon: Secondary | ICD-10-CM | POA: Diagnosis not present

## 2017-06-21 DIAGNOSIS — Z79899 Other long term (current) drug therapy: Secondary | ICD-10-CM | POA: Diagnosis not present

## 2017-06-21 DIAGNOSIS — F17210 Nicotine dependence, cigarettes, uncomplicated: Secondary | ICD-10-CM | POA: Insufficient documentation

## 2017-06-21 DIAGNOSIS — M810 Age-related osteoporosis without current pathological fracture: Secondary | ICD-10-CM | POA: Insufficient documentation

## 2017-06-21 DIAGNOSIS — K219 Gastro-esophageal reflux disease without esophagitis: Secondary | ICD-10-CM | POA: Diagnosis not present

## 2017-06-21 DIAGNOSIS — B1920 Unspecified viral hepatitis C without hepatic coma: Secondary | ICD-10-CM | POA: Diagnosis not present

## 2017-06-21 DIAGNOSIS — E039 Hypothyroidism, unspecified: Secondary | ICD-10-CM | POA: Insufficient documentation

## 2017-06-21 DIAGNOSIS — R109 Unspecified abdominal pain: Secondary | ICD-10-CM

## 2017-06-21 DIAGNOSIS — M8580 Other specified disorders of bone density and structure, unspecified site: Secondary | ICD-10-CM | POA: Insufficient documentation

## 2017-06-21 DIAGNOSIS — Z8249 Family history of ischemic heart disease and other diseases of the circulatory system: Secondary | ICD-10-CM | POA: Diagnosis not present

## 2017-06-21 DIAGNOSIS — R1013 Epigastric pain: Secondary | ICD-10-CM | POA: Diagnosis not present

## 2017-06-21 DIAGNOSIS — Z7982 Long term (current) use of aspirin: Secondary | ICD-10-CM | POA: Insufficient documentation

## 2017-06-21 DIAGNOSIS — K449 Diaphragmatic hernia without obstruction or gangrene: Secondary | ICD-10-CM

## 2017-06-21 DIAGNOSIS — Z888 Allergy status to other drugs, medicaments and biological substances status: Secondary | ICD-10-CM | POA: Insufficient documentation

## 2017-06-21 DIAGNOSIS — J449 Chronic obstructive pulmonary disease, unspecified: Secondary | ICD-10-CM | POA: Diagnosis not present

## 2017-06-21 DIAGNOSIS — K3189 Other diseases of stomach and duodenum: Secondary | ICD-10-CM

## 2017-06-21 DIAGNOSIS — Z8719 Personal history of other diseases of the digestive system: Secondary | ICD-10-CM | POA: Diagnosis not present

## 2017-06-21 DIAGNOSIS — M1990 Unspecified osteoarthritis, unspecified site: Secondary | ICD-10-CM | POA: Diagnosis not present

## 2017-06-21 DIAGNOSIS — K2950 Unspecified chronic gastritis without bleeding: Secondary | ICD-10-CM | POA: Diagnosis not present

## 2017-06-21 DIAGNOSIS — Z885 Allergy status to narcotic agent status: Secondary | ICD-10-CM | POA: Diagnosis not present

## 2017-06-21 HISTORY — DX: Presence of dental prosthetic device (complete) (partial): Z97.2

## 2017-06-21 HISTORY — DX: Spinal stenosis, site unspecified: M48.00

## 2017-06-21 HISTORY — PX: COLONOSCOPY WITH PROPOFOL: SHX5780

## 2017-06-21 HISTORY — PX: ESOPHAGOGASTRODUODENOSCOPY (EGD) WITH PROPOFOL: SHX5813

## 2017-06-21 LAB — HM COLONOSCOPY

## 2017-06-21 SURGERY — COLONOSCOPY WITH PROPOFOL
Anesthesia: General | Wound class: Contaminated

## 2017-06-21 MED ORDER — EPHEDRINE SULFATE 50 MG/ML IJ SOLN
INTRAMUSCULAR | Status: DC | PRN
  Administered 2017-06-21: 5 mg via INTRAVENOUS
  Administered 2017-06-21: 10 mg via INTRAVENOUS

## 2017-06-21 MED ORDER — DEXMEDETOMIDINE HCL 200 MCG/2ML IV SOLN
INTRAVENOUS | Status: DC | PRN
  Administered 2017-06-21: 12 ug via INTRAVENOUS

## 2017-06-21 MED ORDER — GLYCOPYRROLATE 0.2 MG/ML IJ SOLN
INTRAMUSCULAR | Status: DC | PRN
  Administered 2017-06-21: 0.2 mg via INTRAVENOUS

## 2017-06-21 MED ORDER — LACTATED RINGERS IV SOLN
INTRAVENOUS | Status: DC
  Administered 2017-06-21 (×2): via INTRAVENOUS

## 2017-06-21 MED ORDER — OMEPRAZOLE 20 MG PO CPDR: 20.0000 mg | DELAYED_RELEASE_CAPSULE | Freq: Every day | ORAL | 0 refills | Status: DC

## 2017-06-21 MED ORDER — SODIUM CHLORIDE 0.9 % IV SOLN: INTRAVENOUS | Status: DC

## 2017-06-21 MED ORDER — PROPOFOL 10 MG/ML IV BOLUS
INTRAVENOUS | Status: DC | PRN
  Administered 2017-06-21: 30 mg via INTRAVENOUS
  Administered 2017-06-21: 50 mg via INTRAVENOUS
  Administered 2017-06-21 (×3): 20 mg via INTRAVENOUS
  Administered 2017-06-21: 100 mg via INTRAVENOUS
  Administered 2017-06-21 (×2): 30 mg via INTRAVENOUS
  Administered 2017-06-21: 20 mg via INTRAVENOUS
  Administered 2017-06-21 (×3): 50 mg via INTRAVENOUS
  Administered 2017-06-21: 30 mg via INTRAVENOUS
  Administered 2017-06-21 (×2): 20 mg via INTRAVENOUS
  Administered 2017-06-21: 30 mg via INTRAVENOUS
  Administered 2017-06-21 (×4): 50 mg via INTRAVENOUS
  Administered 2017-06-21: 30 mg via INTRAVENOUS

## 2017-06-21 MED ORDER — PHENYLEPHRINE HCL 10 MG/ML IJ SOLN
INTRAMUSCULAR | Status: DC | PRN
  Administered 2017-06-21 (×5): 100 ug via INTRAVENOUS

## 2017-06-21 MED ORDER — FENTANYL CITRATE (PF) 100 MCG/2ML IJ SOLN: 25.0000 ug | INTRAMUSCULAR | Status: DC | PRN

## 2017-06-21 MED ORDER — LIDOCAINE HCL (CARDIAC) 20 MG/ML IV SOLN
INTRAVENOUS | Status: DC | PRN
  Administered 2017-06-21: 50 mg via INTRAVENOUS

## 2017-06-21 SURGICAL SUPPLY — 36 items
BALLN DILATOR 10-12 8 (BALLOONS)
BALLN DILATOR 12-15 8 (BALLOONS)
BALLN DILATOR 15-18 8 (BALLOONS)
BALLN DILATOR CRE 0-12 8 (BALLOONS)
BALLN DILATOR ESOPH 8 10 CRE (MISCELLANEOUS) IMPLANT
BALLOON DILATOR 12-15 8 (BALLOONS) IMPLANT
BALLOON DILATOR 15-18 8 (BALLOONS) IMPLANT
BALLOON DILATOR CRE 0-12 8 (BALLOONS) IMPLANT
BLOCK BITE 60FR ADLT L/F GRN (MISCELLANEOUS) ×2 IMPLANT
CANISTER SUCT 1200ML W/VALVE (MISCELLANEOUS) ×2 IMPLANT
CLIP HMST 235XBRD CATH ROT (MISCELLANEOUS) IMPLANT
CLIP RESOLUTION 360 11X235 (MISCELLANEOUS)
ELECT REM PT RETURN 9FT ADLT (ELECTROSURGICAL)
ELECTRODE REM PT RTRN 9FT ADLT (ELECTROSURGICAL) IMPLANT
FCP ESCP3.2XJMB 240X2.8X (MISCELLANEOUS)
FORCEPS BIOP RAD 4 LRG CAP 4 (CUTTING FORCEPS) ×2 IMPLANT
FORCEPS BIOP RJ4 240 W/NDL (MISCELLANEOUS)
FORCEPS ESCP3.2XJMB 240X2.8X (MISCELLANEOUS) IMPLANT
GOWN CVR UNV OPN BCK APRN NK (MISCELLANEOUS) ×2 IMPLANT
GOWN ISOL THUMB LOOP REG UNIV (MISCELLANEOUS) ×2
INJECTOR VARIJECT VIN23 (MISCELLANEOUS) IMPLANT
KIT DEFENDO VALVE AND CONN (KITS) IMPLANT
KIT ENDO PROCEDURE OLY (KITS) ×2 IMPLANT
MARKER SPOT ENDO TATTOO 5ML (MISCELLANEOUS) IMPLANT
PROBE APC STR FIRE (PROBE) IMPLANT
RETRIEVER NET PLAT FOOD (MISCELLANEOUS) IMPLANT
RETRIEVER NET ROTH 2.5X230 LF (MISCELLANEOUS) IMPLANT
SNARE SHORT THROW 13M SML OVAL (MISCELLANEOUS) IMPLANT
SNARE SHORT THROW 30M LRG OVAL (MISCELLANEOUS) IMPLANT
SNARE SNG USE RND 15MM (INSTRUMENTS) IMPLANT
SPOT EX ENDOSCOPIC TATTOO (MISCELLANEOUS)
SYR INFLATION 60ML (SYRINGE) IMPLANT
TRAP ETRAP POLY (MISCELLANEOUS) IMPLANT
VARIJECT INJECTOR VIN23 (MISCELLANEOUS)
WATER STERILE IRR 250ML POUR (IV SOLUTION) ×2 IMPLANT
WIRE CRE 18-20MM 8CM F G (MISCELLANEOUS) IMPLANT

## 2017-06-21 NOTE — Anesthesia Procedure Notes (Signed)
Procedure Name: MAC Date/Time: 06/21/2017 11:24 AM Performed by: Janna Arch, CRNA Pre-anesthesia Checklist: Patient identified, Emergency Drugs available, Suction available and Patient being monitored Patient Re-evaluated:Patient Re-evaluated prior to induction Oxygen Delivery Method: Nasal cannula

## 2017-06-21 NOTE — Anesthesia Postprocedure Evaluation (Signed)
Anesthesia Post Note  Patient: Georjean ModeMarie Skenes Molla  Procedure(s) Performed: COLONOSCOPY WITH PROPOFOL (N/A ) ESOPHAGOGASTRODUODENOSCOPY (EGD) WITH PROPOFOL (N/A )  Patient location during evaluation: PACU Anesthesia Type: General Level of consciousness: awake and alert Pain management: pain level controlled Vital Signs Assessment: post-procedure vital signs reviewed and stable Respiratory status: spontaneous breathing, nonlabored ventilation, respiratory function stable and patient connected to nasal cannula oxygen Cardiovascular status: blood pressure returned to baseline and stable Postop Assessment: no apparent nausea or vomiting Anesthetic complications: no    Taegan Standage D Jordy Hewins

## 2017-06-21 NOTE — Transfer of Care (Signed)
Immediate Anesthesia Transfer of Care Note  Patient: Catherine Griffin  Procedure(s) Performed: COLONOSCOPY WITH PROPOFOL (N/A ) ESOPHAGOGASTRODUODENOSCOPY (EGD) WITH PROPOFOL (N/A )  Patient Location: PACU  Anesthesia Type: General  Level of Consciousness: awake, alert  and patient cooperative  Airway and Oxygen Therapy: Patient Spontanous Breathing and Patient connected to supplemental oxygen  Post-op Assessment: Post-op Vital signs reviewed, Patient's Cardiovascular Status Stable, Respiratory Function Stable, Patent Airway and No signs of Nausea or vomiting  Post-op Vital Signs: Reviewed and stable  Complications: No apparent anesthesia complications

## 2017-06-21 NOTE — H&P (Signed)
Melodie BouillonVarnita Dashon Mcintire, MD [address], [address], [place], [place], [address] [address], [address], [place], [place], [address] Phone: [phone]  Fax: [phone]  Primary Care Physician:  Karie SchwalbeLetvak, Richard I, MD   Pre-Procedure History & Physical: HPI:  Catherine ModeMarie Skenes Griffin is a 62 y.o. female is here for a colonoscopy and EGD.    Past Medical History:  Diagnosis Date  . Abdominal pain   . Abdominal pain, right upper quadrant   . Actinic keratosis    04/25/07  . Aphthous ulcer of mouth   . Arthritis   . Back pain    04/25/07  . Chest pain    04/25/07  . Diverticulosis of colon    02/28/09  . Fatigue    05/30/08  . Hemorrhoid    12/01/10  . Hepatitis C   . Hyperlipidemia   . Hypothyroidism   . Lichen sclerosus   . Osteopenia   . Osteoporosis    04/25/07  . RUQ abdominal pain    02/07/09  . Spinal stenosis   . Thyroid disease    hypothyrodism  . Vaginitis    10/01/10  . Wears dentures    partial upper and lower    Past Surgical History:  Procedure Laterality Date  . ABDOMINAL HYSTERECTOMY    . TONSILLECTOMY      Prior to Admission medications   Medication Sig Start Date End Date Taking? Authorizing Provider  aspirin 81 MG tablet Take 81 mg by mouth daily.   Yes [provider]  cholecalciferol (VITAMIN D) 1000 UNITS tablet Take 1,000 Units by mouth daily.   Yes [provider]  CYANOCOBALAMIN PO Take 1 tablet by mouth daily.   Yes [provider]  cyclobenzaprine (FLEXERIL) 10 MG tablet 1/2 TO 1 TABLET BY MOUTH AT BEDTIME AS NEEDED. 05/26/17  Yes Karie SchwalbeLetvak, Richard I, MD  ECHINACEA HERB PO Take 1 tablet by mouth daily.   Yes [provider]  HYDROcodone-acetaminophen (NORCO) 10-325 MG tablet Take 1 tablet by mouth 2 (two) times daily as needed. 06/16/17  Yes Eustaquio BoydenGutierrez, Javier, MD  Melatonin 5 MG TABS Take 1 tablet by mouth at bedtime.   Yes [provider]  ibuprofen (ADVIL,MOTRIN) 200 MG tablet Take 400 mg by mouth 3  (three) times daily.    [provider]  omeprazole (PRILOSEC) 40 MG capsule Take 1 capsule (40 mg total) by mouth daily. Patient not taking: Reported on 06/16/2017 05/18/17   Karie SchwalbeLetvak, Richard I, MD    Allergies as of 06/15/2017 - Review Complete 06/15/2017  Allergen Reaction Noted  . Codeine phosphate [codeine] Nausea Only 07/13/2014  . Indomethacin  04/25/2007    Family History  Problem Relation Age of Onset  . Heart disease Father   . Arthritis Father   . Other Father        joint replacement  . Cancer Mother        lung  . Anemia Sister   . Other Sister        CHF  . Breast cancer Sister 6464  . Cancer Sister        1 sister died of metastatic cancer  . Cancer Brother        lung cancer  . Coronary artery disease Neg Hx   . Diabetes Neg Hx     Social History   Socioeconomic History  . Marital status: Married    Spouse name: Not on file  . Number of children: Not on file  .  Years of education: Not on file  . Highest education level: Not on file  Social Needs  . Financial resource strain: Not on file  . Food insecurity - worry: Not on file  . Food insecurity - inability: Not on file  . Transportation needs - medical: Not on file  . Transportation needs - non-medical: Not on file  Occupational History  . 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 Smoking status: Current Every Day Smoker    Packs/day: 1.00    Types: Cigarettes    Last attempt to quit: 04/27/2017    Years since quitting: 0.1  . Smokeless tobacco: Never Used  . Tobacco comment: since age 36  Substance and Sexual Activity  . Alcohol use: No    Alcohol/week: 0.0 oz  . Drug use: No  . Sexual activity: Not on file  Other Topics Concern  . Not on file  Social History Narrative   2 step children    Review of Systems: See HPI, otherwise negative ROS  Physical Exam: BP 111/72   Pulse 92   Temp (!) 97.5 F (36.4 C) (Temporal)   Resp 16   Ht 5'  2.25" (1.581 m)   Wt 137 lb (62.1 kg)   SpO2 97%   BMI 24.86 kg/m  General:   Alert,  pleasant and cooperative in NAD Head:  Normocephalic and atraumatic. Neck:  Supple; no masses or thyromegaly. Lungs:  Clear throughout to auscultation, normal respiratory effort.    Heart:  +S1, +S2, Regular rate and rhythm, No edema. Abdomen:  Soft, nontender and nondistended. Normal bowel sounds, without guarding, and without rebound.   Neurologic:  Alert and  oriented x4;  grossly normal neurologically.  Impression/Plan: Catherine Griffin is here for a colonoscopy to be performed for screening and EGD for acid reflux.  Risks, benefits, limitations, and alternatives regarding the procedures have been reviewed with the patient.  Questions have been answered.  All parties agreeable.   Pasty Spillers, MD  06/21/2017, 11:12 AM

## 2017-06-21 NOTE — Op Note (Signed)
[facility] Gastroenterology Patient Name: Catherine Griffin Procedure Date: 06/21/2017 11:20 AM MRN: [id number] Account #: [id number] Date of Birth: Apr 29, 1955 Admit Type: Outpatient Age: 62 Room: [facility] OR ROOM 01 Gender: Female Note Status: Finalized Procedure:            Upper GI endoscopy Indications:          Epigastric abdominal pain, Heartburn Providers:            Eliab Closson B. Maximino Greenland MD, MD Referring MD:         Karie Schwalbe (Referring MD) Medicines:            Monitored Anesthesia Care Complications:        No immediate complications. Procedure:            Pre-Anesthesia Assessment:                       - The risks and benefits of the procedure and the                        sedation options and risks were discussed with the                        patient. All questions were answered and informed                        consent was obtained.                       - Patient identification and proposed procedure were                        verified prior to the procedure.                       - ASA Grade Assessment: II - A patient with mild                        systemic disease.                       After obtaining informed consent, the endoscope was                        passed under direct vision. Throughout the procedure,                        the patient's blood pressure, pulse, and oxygen                        saturations were monitored continuously. The Olympus                        GIF H180J Endoscope (S#: [id number]) was introduced                        through the mouth, and advanced to the third part of                        duodenum. The upper GI endoscopy was accomplished with  ease. The patient tolerated the procedure well. Findings:      The Z-line was regular and was found 34 cm from the incisors.      Patchy mildly erythematous mucosa without bleeding was found in the       gastric body and in the gastric  antrum. Biopsies were obtained in the       gastric body, at the incisura and in the gastric antrum with cold       forceps for histology.      A few localized, 2 mm non-bleeding erosions were found in the gastric       antrum. There were no stigmata of recent bleeding.      A 1 cm hiatal hernia was present.      Localized mild mucosal changes characterized by congestion and mild       thickening were found in the second portion of the duodenum. Biopsies       were taken with a cold forceps for histology. Care was taken not to       biopsy at or near the ampula.      The duodenal bulb and third portion of the duodenum were normal. Impression:           - Z-line regular, 34 cm from the incisors.                       - Erythematous mucosa in the gastric body and antrum.                       - Non-bleeding erosive gastropathy.                       - 1 cm hiatal hernia.                       - Mucosal changes in the duodenum. Biopsied.                       - Normal duodenal bulb and third portion of the                        duodenum.                       - Biopsies were obtained in the gastric body, at the                        incisura and in the gastric antrum. Recommendation:       - Await pathology results.                       - Take prescribed proton pump inhibitor or H2 blocker                        (antacid) medications 30 - 60 minutes before meals.                       - Discharge patient to home.                       - Advance diet as tolerated.                       -  Return to my office in 4 weeks.                       - Return to primary care physician as previously                        scheduled.                       - Avoid NSAIDs except Aspirin if medically indicated                       - The findings and recommendations were discussed with                        the patient.                       - The findings and recommendations were discussed with                         the patient's family. Procedure Code(s):    --- Professional ---                       [id number], Esophagogastroduodenoscopy, flexible, transoral;                        with biopsy, single or multiple Diagnosis Code(s):    --- Professional ---                       K31.89, Other diseases of stomach and duodenum                       K44.9, Diaphragmatic hernia without obstruction or                        gangrene                       R10.13, Epigastric pain                       R12, Heartburn CPT copyright 2016 American Medical Association. All rights reserved. The codes documented in this report are preliminary and upon coder review may  be revised to meet current compliance requirements.  [name], MD Michel Bickers B. Maximino Greenland MD, MD 06/21/2017 11:51:57 AM This report has been signed electronically. Number of Addenda: 0 Note Initiated On: 06/21/2017 11:20 AM Estimated Blood Loss: Estimated blood loss: none.      [facility]

## 2017-06-21 NOTE — Op Note (Signed)
[facility] Patient Name: Catherine AriasMarie Batte Procedure Date: 06/21/2017 11:53 AM MRN: [id number] Account #: [id number] Date of Birth: 10-03-1955 Admit Type: Outpatient Age: [age] [facility]OM 01 Gender: Female Note Status: Finalized Procedure:            Colonoscopy Indications:          Screening for colorectal malignant neoplasm Providers:            Constantina Laseter B. Maximino Greenlandahiliani MD, MD Referring MD:         Karie Schwalbeichard I. Letvak (Referring MD) Medicines:            Monitored Anesthesia Care Complications:        No immediate complications. Procedure:            Pre-Anesthesia Assessment:                       - Prior to the procedure, a History and Physical was                        performed, and patient medications, allergies and                        sensitivities were reviewed. The patient's tolerance of                        previous anesthesia was reviewed.                       - The risks and benefits of the procedure and the                        sedation options and risks were discussed with the                        patient. All questions were answered and informed                        consent was obtained.                       - Patient identification and proposed procedure were                        verified prior to the procedure by the physician, the                        nurse, the anesthetist and the technician. The                        procedure was verified in the pre-procedure area in the                        procedure room in the endoscopy suite.                       - ASA Grade Assessment: II - A patient with mild                        systemic disease.                       -  After reviewing the risks and benefits, the patient                        was deemed in satisfactory condition to undergo the                        procedure.                       After obtaining informed consent, the colonoscope was               passed under direct vision. Throughout the procedure,                        the patient's blood pressure, pulse, and oxygen                        saturations were monitored continuously. The                        Colonoscope was introduced through the anus and                        advanced to the the cecum, identified by appendiceal                        orifice and ileocecal valve. The colonoscopy was                        performed with ease. The patient tolerated the                        procedure well. The quality of the bowel preparation                        was good. Findings:      The perianal and digital rectal examinations were normal.      The rectum, sigmoid colon, descending colon, transverse colon, ascending       colon and cecum appeared normal.      Retrofexion could not be performed due to a narrow rectum. Careful exam       of the rectum via frontal view did not reveal any abnormalities. Impression:           - The rectum, sigmoid colon, descending colon,                        transverse colon, ascending colon and cecum are normal.                       - No specimens collected. Recommendation:       - Discharge patient to home.                       - Resume previous diet.                       - Continue present medications.                       - Repeat colonoscopy in 5-10 years.                       -  Return to primary care physician as previously                        scheduled.                       - The findings and recommendations were discussed with                        the patient.                       - The findings and recommendations were discussed with                        the patient's family. Procedure Code(s):    --- Professional ---                       D6387, Colorectal cancer screening; colonoscopy on                        individual not meeting criteria for high risk Diagnosis Code(s):    --- Professional ---                        Z12.11, Encounter for screening for malignant neoplasm                        of colon CPT copyright 2016 American Medical Association. All rights reserved. The codes documented in this report are preliminary and upon coder review may  be revised to meet current compliance requirements.  [name], MD Michel Bickers B. Maximino Greenland MD, MD 06/21/2017 12:29:32 PM This report has been signed electronically. Number of Addenda: 0 Note Initiated On: 06/21/2017 11:53 AM Scope Withdrawal Time: 0 hours 22 minutes 8 seconds  Total Procedure Duration: 0 hours 30 minutes 55 seconds  Estimated Blood Loss: Estimated blood loss: none.      [facility]

## 2017-06-21 NOTE — Anesthesia Preprocedure Evaluation (Signed)
Anesthesia Evaluation    Airway Mallampati: II  TM Distance: >3 FB Neck ROM: Full    Dental no notable dental hx.    Pulmonary COPD, Current Smoker,  Smoker. 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  Active. Per patient, lungs at baseline No home O2   Pulmonary exam normal breath sounds clear to auscultation + wheezing      Cardiovascular negative cardio ROS Normal cardiovascular exam Rhythm:Regular Rate:Normal  Mets 4+. 2 stair cases.   Neuro/Psych    GI/Hepatic   Endo/Other  Hypothyroidism   Renal/GU      Musculoskeletal  (+) Arthritis ,   Abdominal   Peds  Hematology   Anesthesia Other Findings   Reproductive/Obstetrics                             Anesthesia Physical Anesthesia Plan  ASA: II  Anesthesia Plan: General   Post-op Pain Management:    Induction: Intravenous  PONV Risk Score and Plan: 1  Airway Management Planned: Natural Airway  Additional Equipment:   Intra-op Plan:   Post-operative Plan: Extubation in OR  Informed Consent: I have reviewed the patients History and Physical, chart, labs and discussed the procedure including the risks, benefits and alternatives for the proposed anesthesia with the patient or authorized representative who has indicated his/her understanding and acceptance.   Dental advisory given  Plan Discussed with: CRNA  Anesthesia Plan Comments:         Anesthesia Quick Evaluation

## 2017-06-22 ENCOUNTER — Telehealth: Payer: Self-pay | Admitting: Gastroenterology

## 2017-06-22 NOTE — Telephone Encounter (Signed)
Left mesg for pt to call office and schedule 4 week fu with Dr. Maximino Greenlandtahiliani

## 2017-06-27 ENCOUNTER — Encounter: Payer: Self-pay | Admitting: Gastroenterology

## 2017-07-01 ENCOUNTER — Other Ambulatory Visit: Payer: Self-pay

## 2017-07-01 MED ORDER — OMEPRAZOLE 20 MG PO CPDR: 20.0000 mg | DELAYED_RELEASE_CAPSULE | Freq: Every day | ORAL | 0 refills | Status: DC

## 2017-07-13 ENCOUNTER — Encounter: Payer: Self-pay | Admitting: Internal Medicine

## 2017-07-14 MED ORDER — HYDROCODONE-ACETAMINOPHEN 10-325 MG PO TABS: 1.0000 | ORAL_TABLET | Freq: Two times a day (BID) | ORAL | 0 refills | Status: DC | PRN

## 2017-07-14 NOTE — Telephone Encounter (Signed)
Last written 06-16-17 #60 Last OV 05-18-17 Next OV 09-09-17  Last UDS/CSA 05-18-17

## 2017-08-11 ENCOUNTER — Other Ambulatory Visit: Payer: Self-pay | Admitting: Internal Medicine

## 2017-08-11 MED ORDER — HYDROCODONE-ACETAMINOPHEN 10-325 MG PO TABS: 1.0000 | ORAL_TABLET | Freq: Two times a day (BID) | ORAL | 0 refills | Status: DC | PRN

## 2017-08-11 NOTE — Telephone Encounter (Signed)
Last filled 07-14-17 #60 Last OV 05-18-17 Next OV 08-22-17 Last UDS/CSA 05-18-17

## 2017-08-22 ENCOUNTER — Encounter: Payer: Self-pay | Admitting: Internal Medicine

## 2017-08-22 ENCOUNTER — Ambulatory Visit: Payer: BLUE CROSS/BLUE SHIELD | Admitting: Internal Medicine

## 2017-08-22 VITALS — BP 118/78 | HR 85 | Temp 97.6°F | Ht 62.0 in | Wt 139.0 lb

## 2017-08-22 DIAGNOSIS — S93601A Unspecified sprain of right foot, initial encounter: Secondary | ICD-10-CM | POA: Diagnosis not present

## 2017-08-22 DIAGNOSIS — M545 Low back pain: Secondary | ICD-10-CM | POA: Diagnosis not present

## 2017-08-22 DIAGNOSIS — G8929 Other chronic pain: Secondary | ICD-10-CM

## 2017-08-22 DIAGNOSIS — F1120 Opioid dependence, uncomplicated: Secondary | ICD-10-CM

## 2017-08-22 NOTE — Assessment & Plan Note (Signed)
Reviewed CSRS--no problems there

## 2017-08-22 NOTE — Assessment & Plan Note (Addendum)
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

## 2017-08-22 NOTE — Assessment & Plan Note (Signed)
Pain is sporadic No evidence of bony injury now---likely soft tissue Discussed support shoes, etc

## 2017-08-22 NOTE — Progress Notes (Signed)
Subjective:    Patient ID: Catherine Griffin, female    DOB: [birth year], 62 y.o.   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 aspirin 81 MG tablet Take 81 mg by mouth daily.    . cholecalciferol (VITAMIN D) 1000 UNITS tablet Take 1,000 Units by mouth daily.    . cyclobenzaprine (FLEXERIL) 10 MG tablet 1/2 TO 1 TABLET BY MOUTH AT BEDTIME AS NEEDED. 30 tablet 3  . HYDROcodone-acetaminophen (NORCO) 10-325 MG tablet Take 1 tablet by mouth 2 (two) times daily as needed. 60 tablet 0  . Melatonin 5 MG TABS Take 1 tablet by mouth at bedtime.     No current facility-administered medications on file prior to visit.     Allergies  Allergen Reactions  . Codeine Phosphate [Codeine] Nausea Only  . Indomethacin     REACTION: Rash/hives/swelling    Past Medical History:  Diagnosis Date  . Abdominal pain   . Abdominal pain, right upper quadrant   . Actinic keratosis    04/25/07  . Aphthous ulcer of mouth   . Arthritis   . Back pain    04/25/07  . Chest pain    04/25/07  . Diverticulosis of colon    02/28/09  . Fatigue    05/30/08  . Hemorrhoid    12/01/10  . Hepatitis C   . Hyperlipidemia   . Hypothyroidism   . Lichen sclerosus   . Osteopenia   . Osteoporosis    04/25/07  . RUQ abdominal pain    02/07/09  . Spinal stenosis   . Thyroid disease    hypothyrodism  . Vaginitis    10/01/10  . Wears dentures    partial upper and lower    Past Surgical History:  Procedure Laterality Date    . ABDOMINAL HYSTERECTOMY    . COLONOSCOPY WITH PROPOFOL N/A 06/21/2017   Procedure: COLONOSCOPY WITH PROPOFOL;  Surgeon: Pasty Spillers, MD;  Location: [facility];  Service: Endoscopy;  Laterality: N/A;  . ESOPHAGOGASTRODUODENOSCOPY (EGD) WITH PROPOFOL N/A 06/21/2017   Procedure: ESOPHAGOGASTRODUODENOSCOPY (EGD) WITH PROPOFOL;  Surgeon: Pasty Spillers, MD;  Location: [facility];  Service: Endoscopy;  Laterality: N/A;  . TONSILLECTOMY      Family History  Problem Relation Age of Onset  . Heart disease Father   . Arthritis Father   . Other Father        joint replacement  . Cancer Mother        lung  . Anemia Sister   . Other Sister        CHF  . Breast cancer Sister 25  . Cancer Sister        1 sister died of metastatic cancer  . Cancer Brother        lung cancer  . Coronary artery disease Neg Hx   . Diabetes Neg Hx     Social History   Socioeconomic History  . Marital status: Married  Spouse name: Not on file  . Number of children: Not on file  . Years of education: Not on file  . Highest education level: Not on file  Occupational History  . 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 Financial resource strain: Not on file  . Food insecurity:    Worry: Not on file    Inability: Not on file  . Transportation needs:    Medical: Not on file    Non-medical: Not on file  Tobacco Use  . Smoking status: Current Every Day Smoker    Packs/day: 1.00    Types: Cigarettes    Last attempt to quit: 04/27/2017    Years since quitting: 0.3  . Smokeless tobacco: Never Used  . Tobacco comment: since age 36  Substance and Sexual Activity  . Alcohol use: No    Alcohol/week: 0.0 oz  . Drug use: No  . Sexual activity: Not on file  Lifestyle  . Physical activity:    Days per week: Not on file    Minutes per session: Not on file  . Stress: Not on file  Relationships  . Social connections:    Talks on phone:  Not on file    Gets together: Not on file    Attends religious service: Not on file    Active member of club or organization: Not on file    Attends meetings of clubs or organizations: Not on file    Relationship status: Not on file  . Intimate partner violence:    Fear of current or ex partner: Not on file    Emotionally abused: Not on file    Physically abused: Not on file    Forced sexual activity: Not on file  Other Topics Concern  . Not on file  Social History Narrative   2 step children   Review of Systems Sleeping okay Appetite is fine    Objective:   Physical Exam  Constitutional: She appears well-developed. No distress.  Musculoskeletal:  Right foot is puffy but not edematous No tenderness over metatarsals, ankle or toes No inflammation          Assessment & Plan:

## 2017-09-06 ENCOUNTER — Ambulatory Visit: Payer: BLUE CROSS/BLUE SHIELD | Admitting: Gastroenterology

## 2017-09-06 VITALS — BP 110/70 | HR 91 | Temp 97.9°F | Ht 62.0 in | Wt 136.8 lb

## 2017-09-06 DIAGNOSIS — K2990 Gastroduodenitis, unspecified, without bleeding: Secondary | ICD-10-CM

## 2017-09-06 DIAGNOSIS — K2970 Gastritis, unspecified, without bleeding: Secondary | ICD-10-CM

## 2017-09-06 DIAGNOSIS — K644 Residual hemorrhoidal skin tags: Secondary | ICD-10-CM

## 2017-09-06 NOTE — Progress Notes (Signed)
[name], MD [address]  [address]  [place], [place] [address]  Main: [phone]  Fax: [phone]   Primary Care Physician: Karie Schwalbe, MD  Primary Gastroenterologist:  Dr. [name]  Chief complaint: Hemorrhoid  HPI: Catherine Griffin is a 63 y.o. female here for follow-up of abdominal pain and gastritis.  Abdominal pain has resolved at this time.  Patient took Prilosec 40 mg daily for 3 to 4 weeks after her EGD in March 2019, and stopped taking it as her abdominal pain had completely resolved with this.  Pain has not reoccurred since she has stopped the medication.  No nausea or vomiting, no melena, hematochezia, weight loss, heartburn, dysphagia or any alarm symptoms.  Today she just reports, that she lifted something heavy, and has had history of hemorrhoids in the past that needed lancing 2 to 3 years ago.  She reports after she lifted something heavy, she felt that her hemorrhoid in her perirectal area.  Denies any bleeding, or pain at the site.  No bright red blood per rectum  Current Outpatient Medications  Medication Sig Dispense Refill  . aspirin 81 MG tablet Take 81 mg by mouth daily.    . cholecalciferol (VITAMIN D) 1000 UNITS tablet Take 1,000 Units by mouth daily.    . cyclobenzaprine (FLEXERIL) 10 MG tablet 1/2 TO 1 TABLET BY MOUTH AT BEDTIME AS NEEDED. 30 tablet 3  . HYDROcodone-acetaminophen (NORCO) 10-325 MG tablet Take 1 tablet by mouth 2 (two) times daily as needed. 60 tablet 0  . Melatonin 5 MG TABS Take 1 tablet by mouth at bedtime.     No current facility-administered medications for this visit.     Allergies as of 09/06/2017 - Review Complete 08/22/2017  Allergen Reaction Noted  . Codeine phosphate [codeine] Nausea Only 07/13/2014  . Indomethacin  04/25/2007    ROS:  General: Negative for anorexia, weight loss, fever, chills, fatigue, weakness. ENT: Negative for hoarseness, difficulty swallowing , nasal  congestion. CV: Negative for chest pain, angina, palpitations, dyspnea on exertion, peripheral edema.  Respiratory: Negative for dyspnea at rest, dyspnea on exertion, cough, sputum, wheezing.  GI: See history of present illness. GU:  Negative for dysuria, hematuria, urinary incontinence, urinary frequency, nocturnal urination.  Endo: Negative for unusual weight change.    Physical Examination:   There were no vitals taken for this visit.  General: Well-nourished, well-developed in no acute distress.  Eyes: No icterus. Conjunctivae pink. Mouth: Oropharyngeal mucosa moist and pink , no lesions erythema or exudate. Neck: Supple, Trachea midline Abdomen: Bowel sounds are normal, nontender, nondistended, no hepatosplenomegaly or masses, no abdominal bruits or hernia , no rebound or guarding.   Rectal: External large hemorrhoid, nonbleeding Extremities: No lower extremity edema. No clubbing or deformities. Neuro: Alert and oriented x 3.  Grossly intact. Skin: Warm and dry, no jaundice.   Psych: Alert and cooperative, normal mood and affect.   Labs: CMP     Component Value Date/Time   NA 139 10/29/2016 1209   K 3.8 10/29/2016 1209   CL 103 10/29/2016 1209   CO2 29 10/29/2016 1209   GLUCOSE 95 10/29/2016 1209   BUN 21 10/29/2016 1209   CREATININE 0.90 10/29/2016 1209   CALCIUM 9.5 10/29/2016 1209   PROT 6.9 10/29/2016 1209   ALBUMIN 3.9 10/29/2016 1209   AST 19 10/29/2016 1209   ALT 10 10/29/2016 1209   ALKPHOS 61 10/29/2016 1209   BILITOT 0.2 10/29/2016 1209   GFRNONAA 79.00  02/07/2009 1220   GFRAA 96 05/30/2008 0000   Lab Results  Component Value Date   WBC 7.1 10/29/2016   HGB 14.2 10/29/2016   HCT 42.1 10/29/2016   MCV 94.1 10/29/2016   PLT 221.0 10/29/2016    Imaging Studies: No results found.  Assessment and Plan:   Catherine Griffin is a 62 y.o. y/o female initially referred to [place] for abdominal pain, which has completely resolved after taking 3 to 4 weeks  of Prilosec, EGD showed gastritis and gastric erosions, now with an external hemorrhoid which is nonbleeding  Due to large external hemorrhoid, we will refer to surgery for evaluation of hemorrhoidectomy High-fiber diet MiraLAX daily Maintain soft stool  Since abdominal pain is completely resolved, and was due to gastritis, no indication for any further testing If pain reoccurs, patient was asked to call us, H2RA versus PPI can be considered at that time Patient is not on any PPI or H2 RA at this time since pain has resolved  Colonoscopy up-to-date, see colonoscopy report   Dr [name]

## 2017-09-07 ENCOUNTER — Encounter: Payer: Self-pay | Admitting: Gastroenterology

## 2017-09-07 ENCOUNTER — Other Ambulatory Visit: Payer: Self-pay | Admitting: Internal Medicine

## 2017-09-07 NOTE — Telephone Encounter (Signed)
  Pt requesting refill Hydrocodone apap Last refilled and qty;# 60 on 08/11/17 Last seen:08/22/17 [facility] UDS:05/18/17

## 2017-09-08 MED ORDER — HYDROCODONE-ACETAMINOPHEN 10-325 MG PO TABS: 1.0000 | ORAL_TABLET | Freq: Two times a day (BID) | ORAL | 0 refills | Status: DC | PRN

## 2017-09-21 ENCOUNTER — Ambulatory Visit: Payer: BLUE CROSS/BLUE SHIELD | Admitting: Gastroenterology

## 2017-09-22 ENCOUNTER — Other Ambulatory Visit: Payer: Self-pay | Admitting: Internal Medicine

## 2017-09-22 NOTE — Telephone Encounter (Signed)
Approved: #30 x 3 

## 2017-10-06 ENCOUNTER — Other Ambulatory Visit: Payer: Self-pay | Admitting: Internal Medicine

## 2017-10-06 MED ORDER — HYDROCODONE-ACETAMINOPHEN 10-325 MG PO TABS: 1.0000 | ORAL_TABLET | Freq: Two times a day (BID) | ORAL | 0 refills | Status: DC | PRN

## 2017-10-06 NOTE — Telephone Encounter (Signed)
Last filled 09-08-17 #60 Last OV 08-22-17 Next OV 11-28-17  Last UDS/CSA 05-18-17  [facility]

## 2017-11-02 ENCOUNTER — Other Ambulatory Visit: Payer: Self-pay | Admitting: Internal Medicine

## 2017-11-03 MED ORDER — HYDROCODONE-ACETAMINOPHEN 10-325 MG PO TABS: 1.0000 | ORAL_TABLET | Freq: Two times a day (BID) | ORAL | 0 refills | Status: DC | PRN

## 2017-11-03 NOTE — Telephone Encounter (Signed)
Name of Medication: Hydrocodone Name of [facility]acy: [facility] Last Fill or Written Date and Quantity: 10-06-17 #60 Last Office Visit and Type: 08-22-17 3 Mo F/U Next Office Visit and Type: 11-28-17 3 Mo F/U Last Controlled Substance Agreement Date: 05-18-17 Last UDS: 05-18-17  [facility] Dr Karle [facility]arch[facility]'s absence

## 2017-11-28 ENCOUNTER — Ambulatory Visit (INDEPENDENT_AMBULATORY_CARE_PROVIDER_SITE_OTHER): Payer: BLUE CROSS/BLUE SHIELD | Admitting: Internal Medicine

## 2017-11-28 ENCOUNTER — Encounter: Payer: Self-pay | Admitting: Internal Medicine

## 2017-11-28 VITALS — BP 108/70 | HR 84 | Temp 97.9°F | Ht 62.5 in | Wt 138.0 lb

## 2017-11-28 DIAGNOSIS — M545 Low back pain: Secondary | ICD-10-CM

## 2017-11-28 DIAGNOSIS — J449 Chronic obstructive pulmonary disease, unspecified: Secondary | ICD-10-CM

## 2017-11-28 DIAGNOSIS — G8929 Other chronic pain: Secondary | ICD-10-CM

## 2017-11-28 DIAGNOSIS — Z0000 Encounter for general adult medical examination without abnormal findings: Secondary | ICD-10-CM

## 2017-11-28 DIAGNOSIS — F1120 Opioid dependence, uncomplicated: Secondary | ICD-10-CM

## 2017-11-28 NOTE — Assessment & Plan Note (Signed)
No sig symptoms Unfortunately, not able to stop smoking

## 2017-11-28 NOTE — Assessment & Plan Note (Signed)
Basically healthy Unable to stop smoking---has tried No Pap due to hyster mammo due 3/21 Just had benign colon prevnar next year Annual flu vaccine

## 2017-11-28 NOTE — Assessment & Plan Note (Signed)
Reviewed CSRS--no concerns 

## 2017-11-28 NOTE — Progress Notes (Signed)
Subjective:    Patient ID: Catherine Griffin, female    DOB: Sep 16, 1955, 62 y.o.   MRN: [id number]  HPI Here for physical and follow up of chronic health conditions  Schedule will cool off with the end of pool season Weekly checks only during the winter Plans to continue the work for some time Stress with siblings dying--now sister with CHF on hospice  Still smoking---tries to quit and does at times Always winds up going back to them No significant cough Occasionally walks but not much Breathing okay CT signs of COPD  Uses the hydrocodone up to bid This seems to control things 1/2 bid and then 1 at bedtime (that is the hardest time) Melatonin and flexeril at night  Current Outpatient Medications on File Prior to Visit  Medication Sig Dispense Refill  . aspirin 81 MG tablet Take 81 mg by mouth daily.    . cholecalciferol (VITAMIN D) 1000 UNITS tablet Take 1,000 Units by mouth daily.    . cyclobenzaprine (FLEXERIL) 10 MG tablet 1/2 TO 1 TABLET BY MOUTH AT BEDTIME AS NEEDED. 30 tablet 3  . HYDROcodone-acetaminophen (NORCO) 10-325 MG tablet Take 1 tablet by mouth 2 (two) times daily as needed. 60 tablet 0  . Melatonin 5 MG TABS Take 1 tablet by mouth at bedtime.     No current facility-administered medications on file prior to visit.     Allergies  Allergen Reactions  . Codeine Phosphate [Codeine] Nausea Only  . Indomethacin     REACTION: Rash/hives/swelling    Past Medical History:  Diagnosis Date  . Abdominal pain   . Abdominal pain, right upper quadrant   . Actinic keratosis    04/25/07  . Aphthous ulcer of mouth   . Arthritis   . Back pain    04/25/07  . Chest pain    04/25/07  . Diverticulosis of colon    02/28/09  . Fatigue    05/30/08  . Hemorrhoid    12/01/10  . Hepatitis C   . Hyperlipidemia   . Hypothyroidism   . Lichen sclerosus   . Osteopenia   . Osteoporosis    04/25/07  . RUQ abdominal pain    02/07/09  . Spinal stenosis   . Thyroid  disease    hypothyrodism  . Vaginitis    10/01/10  . Wears dentures    partial upper and lower    Past Surgical History:  Procedure Laterality Date  . ABDOMINAL HYSTERECTOMY    . COLONOSCOPY WITH PROPOFOL N/A 06/21/2017   Procedure: COLONOSCOPY WITH PROPOFOL;  Surgeon: Pasty Spillersahiliani, Varnita B, MD;  [facility];  Service: Endoscopy;  Laterality: N/A;  . ESOPHAGOGASTRODUODENOSCOPY (EGD) WITH PROPOFOL N/A 06/21/2017   Procedure: ESOPHAGOGASTRODUODENOSCOPY (EGD) WITH PROPOFOL;  Surgeon: Pasty Spillersahiliani, Varnita B, MD;  [facility];  Service: Endoscopy;  Laterality: N/A;  . TONSILLECTOMY      Family History  Problem Relation Age of Onset  . Heart disease Father   . Arthritis Father   . Other Father        joint replacement  . Cancer Mother        lung  . Anemia Sister   . Other Sister        CHF  . Breast cancer Sister 1958  . Cancer Sister        1 sister died of metastatic cancer  . Congestive Heart Failure Sister   . Cancer Brother  lung cancer  . Coronary artery disease Neg Hx   . Diabetes Neg Hx     Social History   Socioeconomic History  . Marital status: Married    Spouse name: Not on file  . Number of children: Not on file  . Years of education: Not on file  . Highest education level: Not on file  Occupational History  . 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 Financial resource strain: Not on file  . Food insecurity:    Worry: Not on file    Inability: Not on file  . Transportation needs:    Medical: Not on file    Non-medical: Not on file  Tobacco Use  . Smoking status: Current Every Day Smoker    Packs/day: 1.00    Types: Cigarettes    Last attempt to quit: 04/27/2017    Years since quitting: 0.5  . Smokeless tobacco: Never Used  . Tobacco comment: since age 1  Substance and Sexual Activity  . Alcohol use: No    Alcohol/week: 0.0 standard drinks  . Drug use: No  . Sexual  activity: Not on file  Lifestyle  . Physical activity:    Days per week: Not on file    Minutes per session: Not on file  . Stress: Not on file  Relationships  . Social connections:    Talks on phone: Not on file    Gets together: Not on file    Attends religious service: Not on file    Active member of club or organization: Not on file    Attends meetings of clubs or organizations: Not on file    Relationship status: Not on file  . Intimate partner violence:    Fear of current or ex partner: Not on file    Emotionally abused: Not on file    Physically abused: Not on file    Forced sexual activity: Not on file  Other Topics Concern  . Not on file  Social History Narrative   2 step children   Review of Systems  Constitutional: Negative for fatigue and unexpected weight change.       Wears seat belt  HENT: Negative for hearing loss, tinnitus and trouble swallowing.        Periodontal work and implants  Eyes: Negative for visual disturbance.       No diplopia or unilateral vision loss  Respiratory: Negative for cough and chest tightness.   Cardiovascular: Negative for chest pain and palpitations.       Some dependent edema--resolves with activity  Gastrointestinal: Negative for blood in stool and constipation.       No heartburn  Endocrine: Negative for polydipsia and polyuria.  Genitourinary: Negative for difficulty urinating, dyspareunia and hematuria.  Musculoskeletal: Positive for back pain.       Some knee pain  Skin: Negative for rash.  Allergic/Immunologic: Positive for environmental allergies. Negative for immunocompromised state.       Takes benedryl at night prn  Neurological: Negative for dizziness, syncope, light-headedness and headaches.  Hematological: Negative for adenopathy. Bruises/bleeds easily.  Psychiatric/Behavioral: Negative for dysphoric mood and sleep disturbance. The patient is not nervous/anxious.        Objective:   Physical Exam    Constitutional: She is oriented to person, place, and time. She appears well-developed. No distress.  HENT:  Head: Normocephalic and atraumatic.  Right Ear: External ear normal.  Left Ear: External ear  normal.  Mouth/Throat: Oropharynx is clear and moist. No oropharyngeal exudate.  Eyes: Pupils are equal, round, and reactive to light. Conjunctivae are normal.  Neck: No thyromegaly present.  Cardiovascular: Normal rate, regular rhythm, normal heart sounds and intact distal pulses. Exam reveals no gallop.  No murmur heard. Respiratory: Effort normal and breath sounds normal. No respiratory distress. She has no wheezes. She has no rales.  GI: Soft. There is no tenderness.  Musculoskeletal: She exhibits no edema or tenderness.  Lymphadenopathy:    She has no cervical adenopathy.  Neurological: She is alert and oriented to person, place, and time.  Skin: No rash noted. No erythema.  Psychiatric: She has a normal mood and affect. Her behavior is normal.           Assessment & Plan:

## 2017-11-28 NOTE — Assessment & Plan Note (Signed)
Doing okay with the narcotics Discussed fitness

## 2017-11-29 LAB — CBC
HCT: 44.3 % (ref 36.0–46.0)
Hemoglobin: 14.9 g/dL (ref 12.0–15.0)
MCHC: 33.6 g/dL (ref 30.0–36.0)
MCV: 92.4 fl (ref 78.0–100.0)
PLATELETS: 248 10*3/uL (ref 150.0–400.0)
RBC: 4.79 Mil/uL (ref 3.87–5.11)
RDW: 14.1 % (ref 11.5–15.5)
WBC: 8.3 10*3/uL (ref 4.0–10.5)

## 2017-11-29 LAB — COMPREHENSIVE METABOLIC PANEL
ALK PHOS: 74 U/L (ref 39–117)
ALT: 7 U/L (ref 0–35)
AST: 16 U/L (ref 0–37)
Albumin: 3.9 g/dL (ref 3.5–5.2)
BILIRUBIN TOTAL: 0.2 mg/dL (ref 0.2–1.2)
BUN: 18 mg/dL (ref 6–23)
CALCIUM: 9.8 mg/dL (ref 8.4–10.5)
CO2: 32 mEq/L (ref 19–32)
Chloride: 102 mEq/L (ref 96–112)
Creatinine, Ser: 0.91 mg/dL (ref 0.40–1.20)
GFR: 66.08 mL/min (ref >60.00)
GLUCOSE: 95 mg/dL (ref 70–99)
Potassium: 4.6 mEq/L (ref 3.5–5.1)
Sodium: 139 mEq/L (ref 135–145)
TOTAL PROTEIN: 7.4 g/dL (ref 6.0–8.3)

## 2017-12-04 ENCOUNTER — Other Ambulatory Visit: Payer: Self-pay | Admitting: Internal Medicine

## 2017-12-05 MED ORDER — HYDROCODONE-ACETAMINOPHEN 10-325 MG PO TABS: 1.0000 | ORAL_TABLET | Freq: Two times a day (BID) | ORAL | 0 refills | Status: DC | PRN

## 2017-12-05 NOTE — Telephone Encounter (Signed)
[place] pt Last filled for 11/05/17 #60, last OV [facility]/17 [facility]

## 2018-01-02 ENCOUNTER — Other Ambulatory Visit: Payer: Self-pay | Admitting: Internal Medicine

## 2018-01-03 MED ORDER — HYDROCODONE-ACETAMINOPHEN 10-325 MG PO TABS: 1.0000 | ORAL_TABLET | Freq: Two times a day (BID) | ORAL | 0 refills | Status: DC | PRN

## 2018-01-03 NOTE — Telephone Encounter (Signed)
Name of Medication: Hydrocodone Name of [facility] Last Fill or Written Date and Quantity: 12-05-17 #60 Last Office Visit and Type: 3 Month F/U 11-28-17 Next Office Visit and Type: 3 Month F/U 03-02-18 Last Controlled Substance Agreement Date: 05-18-17 Last UDS: 05-18-17

## 2018-01-12 ENCOUNTER — Other Ambulatory Visit: Payer: Self-pay | Admitting: Internal Medicine

## 2018-02-01 ENCOUNTER — Other Ambulatory Visit: Payer: Self-pay

## 2018-02-01 MED ORDER — HYDROCODONE-ACETAMINOPHEN 10-325 MG PO TABS: 1.0000 | ORAL_TABLET | Freq: Two times a day (BID) | ORAL | 0 refills | Status: DC | PRN

## 2018-02-01 NOTE — Telephone Encounter (Signed)
Name of Medication: Hydrocodone Name of [facility]acy[facility] or Written Date and Quantity: 01-03-18 #60 Last Office Visit and Type: 3 month f/u 11-28-17 Next Office Visit and Type: 3 month f/u 03-02-18 Last Controlled Substance Agreement Date: 05-18-17 Last UDS: 05-18-17

## 2018-03-02 ENCOUNTER — Encounter: Payer: Self-pay | Admitting: Internal Medicine

## 2018-03-02 ENCOUNTER — Ambulatory Visit: Payer: BLUE CROSS/BLUE SHIELD | Admitting: Internal Medicine

## 2018-03-02 VITALS — BP 104/74 | HR 80 | Temp 98.1°F | Ht 63.0 in | Wt 144.0 lb

## 2018-03-02 DIAGNOSIS — F1120 Opioid dependence, uncomplicated: Secondary | ICD-10-CM | POA: Diagnosis not present

## 2018-03-02 DIAGNOSIS — G8929 Other chronic pain: Secondary | ICD-10-CM | POA: Diagnosis not present

## 2018-03-02 DIAGNOSIS — Z23 Encounter for immunization: Secondary | ICD-10-CM | POA: Diagnosis not present

## 2018-03-02 DIAGNOSIS — M545 Low back pain, unspecified: Secondary | ICD-10-CM

## 2018-03-02 MED ORDER — HYDROCODONE-ACETAMINOPHEN 10-325 MG PO TABS: 1.0000 | ORAL_TABLET | Freq: Two times a day (BID) | ORAL | 0 refills | Status: DC | PRN

## 2018-03-02 NOTE — Assessment & Plan Note (Signed)
Reviewed CSRS No concerns 

## 2018-03-02 NOTE — Assessment & Plan Note (Signed)
This has been stable Only stretches at night Discussed core strengthening

## 2018-03-02 NOTE — Progress Notes (Signed)
Subjective:    Patient ID: Catherine Griffin, female    DOB: 02-Dec-1955, 62 y.o.   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 aspirin 81 MG tablet Take 81 mg by mouth daily.    . cholecalciferol (VITAMIN D) 1000 UNITS tablet Take 1,000 Units by mouth daily.    . cyclobenzaprine (FLEXERIL) 10 MG tablet 1/2 TO 1 TABLET BY MOUTH AT BEDTIME AS NEEDED. 30 tablet 3  . HYDROcodone-acetaminophen (NORCO) 10-325 MG tablet Take 1 tablet by mouth 2 (two) times daily as needed. 60 tablet 0  . Melatonin 5 MG TABS Take 1 tablet by mouth at bedtime.     No current facility-administered medications on file prior to visit.     Allergies  Allergen Reactions  . Codeine Phosphate [Codeine] Nausea Only  . Indomethacin     REACTION: Rash/hives/swelling    Past Medical History:  Diagnosis Date  . Abdominal pain   . Abdominal pain, right upper quadrant   . Actinic keratosis    04/25/07  . Aphthous ulcer of mouth   . Arthritis   . Back pain    04/25/07  . Chest pain    04/25/07  . Diverticulosis of colon    02/28/09  . Fatigue    05/30/08  . Hemorrhoid    12/01/10  . Hepatitis C   . Hyperlipidemia   . Hypothyroidism   . Lichen sclerosus   . Osteopenia   . Osteoporosis    04/25/07  . RUQ abdominal pain    02/07/09  . Spinal stenosis   . Thyroid disease    hypothyrodism  . Vaginitis    10/01/10  . Wears dentures    partial upper and lower    Past Surgical History:  Procedure Laterality Date  . ABDOMINAL HYSTERECTOMY    . COLONOSCOPY WITH PROPOFOL N/A 06/21/2017   Procedure: COLONOSCOPY WITH PROPOFOL;  Surgeon: Pasty Spillersahiliani, Varnita B, MD;   [facility];  Service: Endoscopy;  Laterality: N/A;  . ESOPHAGOGASTRODUODENOSCOPY (EGD) WITH PROPOFOL N/A 06/21/2017   Procedure: ESOPHAGOGASTRODUODENOSCOPY (EGD) WITH PROPOFOL;  Surgeon: Pasty Spillersahiliani, Varnita B, MD;  [facility];  Service: Endoscopy;  Laterality: N/A;  . TONSILLECTOMY      Family History  Problem Relation Age of Onset  . Heart disease Father   . Arthritis Father   . Other Father        joint replacement  . Cancer Mother        lung  . Anemia Sister   . Other Sister        CHF  . Breast cancer Sister [id number]  . Cancer Sister        1 sister died of metastatic cancer  . Congestive Heart Failure Sister   . Cancer Brother        lung cancer  . Coronary artery disease Neg Hx   . Diabetes Neg Hx     Social History   Socioeconomic History  . Marital status: Married    Spouse name: Not on file  . Number of children: Not on file  . Years of education: Not on file  .  Highest education level: Not on file  Occupational History  . 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 Financial resource strain: Not on file  . Food insecurity:    Worry: Not on file    Inability: Not on file  . Transportation needs:    Medical: Not on file    Non-medical: Not on file  Tobacco Use  . Smoking status: Current Every Day Smoker    Packs/day: 1.00    Types: Cigarettes    Last attempt to quit: 04/27/2017    Years since quitting: 0.8  . Smokeless tobacco: Never Used  . Tobacco comment: since age 33  Substance and Sexual Activity  . Alcohol use: No    Alcohol/week: 0.0 standard drinks  . Drug use: No  . Sexual activity: Not on file  Lifestyle  . Physical activity:    Days per week: Not on file    Minutes per session: Not on file  . Stress: Not on file  Relationships  . Social connections:    Talks on phone: Not on file    Gets together: Not on file    Attends religious service: Not on file    Active  member of club or organization: Not on file    Attends meetings of clubs or organizations: Not on file    Relationship status: Not on file  . Intimate partner violence:    Fear of current or ex partner: Not on file    Emotionally abused: Not on file    Physically abused: Not on file    Forced sexual activity: Not on file  Other Topics Concern  . Not on file  Social History Narrative   2 step children   Review of Systems  Sleeps well Appetite fine Weight stable     Objective:   Physical Exam  Constitutional: No distress.  Psychiatric: She has a normal mood and affect. Her behavior is normal.           Assessment & Plan:

## 2018-03-02 NOTE — Addendum Note (Signed)
Addended by: Eual FinesBRIDGES, Annalyse Langlais P on: 03/02/2018 03:41 PM   Modules accepted: Orders

## 2018-03-30 ENCOUNTER — Other Ambulatory Visit: Payer: Self-pay

## 2018-03-30 MED ORDER — HYDROCODONE-ACETAMINOPHEN 10-325 MG PO TABS: 1.0000 | ORAL_TABLET | Freq: Two times a day (BID) | ORAL | 0 refills | Status: DC | PRN

## 2018-03-30 NOTE — Telephone Encounter (Signed)
Name of Medication: Hydrocodone Name of [facility]acy: [facility] Last Fill or Written Date and Quantity: 03-02-18 #60 Last Office Visit and Type: 3 Month F/U Next Office Visit and Type: No Scheduled Last Controlled Substance Agreement Date: 06-07-17 Last UDS: 06-07-17

## 2018-04-28 ENCOUNTER — Other Ambulatory Visit: Payer: Self-pay | Admitting: Internal Medicine

## 2018-04-28 MED ORDER — HYDROCODONE-ACETAMINOPHEN 10-325 MG PO TABS: 1.0000 | ORAL_TABLET | Freq: Two times a day (BID) | ORAL | 0 refills | Status: DC | PRN

## 2018-04-28 NOTE — Telephone Encounter (Signed)
Name of Medication:  Hydrocodone Name of Pharmacy:  [facility] or Written Date and Quantity: 03-30-18 #60 Last Office Visit and Type: 3 Month F/U 03-02-18 Next Office Visit and Type: 3 Month F/U 05-29-18 Last Controlled Substance Agreement Date: 05-18-17 Last UDS: 05-18-17

## 2018-04-30 ENCOUNTER — Other Ambulatory Visit: Payer: Self-pay | Admitting: Internal Medicine

## 2018-05-01 MED ORDER — HYDROCODONE-ACETAMINOPHEN 10-325 MG PO TABS: 1.0000 | ORAL_TABLET | Freq: Two times a day (BID) | ORAL | 0 refills | Status: DC | PRN

## 2018-05-01 NOTE — Telephone Encounter (Signed)
[facility]---I sent it to [facility] instead

## 2018-05-03 ENCOUNTER — Telehealth: Payer: Self-pay

## 2018-05-03 NOTE — Telephone Encounter (Signed)
Your request has been successfully sent to [facility] for review. You may close this dialog, return to your dashboard, and perform other tasks.  Your request will be reviewed within 24 hours. If needed, you may contact [facility] at [phone]

## 2018-05-04 NOTE — Telephone Encounter (Signed)
PA was approved, but it says for 30 days. I am not sure if that means another PA will have to be done for each month.

## 2018-05-29 ENCOUNTER — Encounter: Payer: Self-pay | Admitting: Internal Medicine

## 2018-05-29 ENCOUNTER — Ambulatory Visit (INDEPENDENT_AMBULATORY_CARE_PROVIDER_SITE_OTHER): Payer: PRIVATE HEALTH INSURANCE | Admitting: Internal Medicine

## 2018-05-29 VITALS — BP 122/80 | HR 83 | Temp 97.8°F | Ht 62.5 in | Wt 142.5 lb

## 2018-05-29 DIAGNOSIS — M545 Low back pain, unspecified: Secondary | ICD-10-CM

## 2018-05-29 DIAGNOSIS — G8929 Other chronic pain: Secondary | ICD-10-CM | POA: Diagnosis not present

## 2018-05-29 DIAGNOSIS — F1120 Opioid dependence, uncomplicated: Secondary | ICD-10-CM

## 2018-05-29 MED ORDER — HYDROCODONE-ACETAMINOPHEN 10-325 MG PO TABS: 1.0000 | ORAL_TABLET | Freq: Two times a day (BID) | ORAL | 0 refills | Status: DC | PRN

## 2018-05-29 NOTE — Assessment & Plan Note (Signed)
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

## 2018-05-29 NOTE — Assessment & Plan Note (Signed)
Reviewed CSRS---no other Rx other than this office

## 2018-05-29 NOTE — Progress Notes (Signed)
Subjective:    Patient ID: [name], female    DOB: 09-07-1955, 63 y.o.   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 aspirin 81 MG tablet Take 81 mg by mouth daily.    . cholecalciferol (VITAMIN D) 1000 UNITS tablet Take 1,000 Units by mouth daily.    . cyclobenzaprine (FLEXERIL) 10 MG tablet 1/2 TO 1 TABLET BY MOUTH AT BEDTIME AS NEEDED. 30 tablet 3  . diphenhydrAMINE (BENADRYL) 25 mg capsule Take 25 mg by mouth every 6 (six) hours as needed.    Marland Kitchen HYDROcodone-acetaminophen (NORCO) 10-325 MG tablet Take 1 tablet by mouth 2 (two) times daily as needed. 60 tablet 0  . Melatonin 5 MG TABS Take 1 tablet by mouth at bedtime.     No current facility-administered medications on file prior to visit.     Allergies  Allergen Reactions  . Codeine Phosphate [Codeine] Nausea Only  . Indomethacin     REACTION: Rash/hives/swelling    Past Medical History:  Diagnosis Date  . Abdominal pain   . Abdominal pain, right upper quadrant   . Actinic keratosis    04/25/07  . Aphthous ulcer of mouth   . Arthritis   . Back pain    04/25/07  . Chest pain    04/25/07  . Diverticulosis of colon    02/28/09  . Fatigue    05/30/08  . Hemorrhoid    12/01/10  . Hepatitis C   . Hyperlipidemia   . Hypothyroidism   . Lichen sclerosus   . Osteopenia   . Osteoporosis    04/25/07  . RUQ abdominal pain    02/07/09  . Spinal stenosis   . Thyroid disease    hypothyrodism  . Vaginitis    10/01/10  . Wears dentures    partial upper and lower    Past Surgical History:  Procedure Laterality Date  . ABDOMINAL HYSTERECTOMY    .  COLONOSCOPY WITH PROPOFOL N/A 06/21/2017   Procedure: COLONOSCOPY WITH PROPOFOL;  Surgeon: Pasty Spillers, MD;  Location[facility];  Service: Endoscopy;  Laterality: N/A;  . ESOPHAGOGASTRODUODENOSCOPY (EGD) WITH PROPOFOL N/A 06/21/2017   Procedure: ESOPHAGOGASTRODUODENOSCOPY (EGD) WITH PROPOFOL;  Surgeon: Pasty Spillers, MD;  Location: [facility];  Service: Endoscopy;  Laterality: N/A;  . TONSILLECTOMY      Family History  Problem Relation Age of Onset  . Heart disease Father   . Arthritis Father   . Other Father        joint replacement  . Cancer Mother        lung  . Anemia Sister   . Other Sister        CHF  . Breast cancer Sister 9  . Cancer Sister        1 sister died of metastatic cancer  . Congestive Heart Failure Sister   . Cancer Brother        lung cancer  . Coronary artery disease Neg Hx   . Diabetes Neg Hx     Social History   Socioeconomic History  . Marital status: Married  Spouse name: Not on file  . Number of children: Not on file  . Years of education: Not on file  . Highest education level: Not on file  Occupational History  . 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 Financial resource strain: Not on file  . Food insecurity:    Worry: Not on file    Inability: Not on file  . Transportation needs:    Medical: Not on file    Non-medical: Not on file  Tobacco Use  . Smoking status: Current Every Day Smoker    Packs/day: 1.00    Types: Cigarettes    Last attempt to quit: 04/27/2017    Years since quitting: 1.0  . Smokeless tobacco: Never Used  . Tobacco comment: since age 78  Substance and Sexual Activity  . Alcohol use: No    Alcohol/week: 0.0 standard drinks  . Drug use: No  . Sexual activity: Not on file  Lifestyle  . Physical activity:    Days per week: Not on file    Minutes per session: Not on file  . Stress: Not on file  Relationships  . Social connections:     Talks on phone: Not on file    Gets together: Not on file    Attends religious service: Not on file    Active member of club or organization: Not on file    Attends meetings of clubs or organizations: Not on file    Relationship status: Not on file  . Intimate partner violence:    Fear of current or ex partner: Not on file    Emotionally abused: Not on file    Physically abused: Not on file    Forced sexual activity: Not on file  Other Topics Concern  . Not on file  Social History Narrative   2 step children   Review of Systems  Bowels okay Appetite is good     Objective:   Physical Exam  Constitutional: She appears well-developed. No distress.  Psychiatric: She has a normal mood and affect. Her behavior is normal.           Assessment & Plan:

## 2018-05-31 ENCOUNTER — Ambulatory Visit: Payer: BLUE CROSS/BLUE SHIELD | Admitting: Internal Medicine

## 2018-06-25 ENCOUNTER — Other Ambulatory Visit: Payer: Self-pay

## 2018-06-26 MED ORDER — HYDROCODONE-ACETAMINOPHEN 10-325 MG PO TABS: 1.0000 | ORAL_TABLET | Freq: Two times a day (BID) | ORAL | 0 refills | Status: DC | PRN

## 2018-06-26 NOTE — Telephone Encounter (Signed)
Name of Medication: Hydrocodone Name of [facility]acy: [facility] or Written Date and Quantity: 05-29-18 #60 Last Office Visit and Type: 3 month f/u 05-29-18 Next Office Visit and Type: 3 Month f/u 08-24-18 Last Controlled Substance Agreement Date: 05-18-17 Last UDS: 05-18-17

## 2018-07-06 ENCOUNTER — Ambulatory Visit: Payer: BLUE CROSS/BLUE SHIELD | Admitting: Internal Medicine

## 2018-07-25 ENCOUNTER — Other Ambulatory Visit: Payer: Self-pay

## 2018-07-26 MED ORDER — HYDROCODONE-ACETAMINOPHEN 10-325 MG PO TABS: 1.0000 | ORAL_TABLET | Freq: Two times a day (BID) | ORAL | 0 refills | Status: DC | PRN

## 2018-07-26 NOTE — Telephone Encounter (Signed)
Name of Medication: Hydrocodone Name of Pharmacy: [facility] or Written Date and Quantity: 06-26-18 #60 Last Office Visit and Type: 3 Month F/U 05-29-18 Next Office Visit and Type: 3 Month F/U 08-24-18 Last Controlled Substance Agreement Date: 05-18-17 Last UDS: 05-18-17

## 2018-08-02 ENCOUNTER — Telehealth: Payer: Self-pay

## 2018-08-02 NOTE — Telephone Encounter (Signed)
PA submitted for Flexeril today. Awaiting response.

## 2018-08-02 NOTE — Telephone Encounter (Signed)
Catherine Griffin with [facility] [employer] left a voicemail stating that they had received a request for PA on Cyclobenzaprine. Catherine Griffin stated that they need to know if member is having acute muscle spasms. Catherine Griffin requested a call back with this information. Call back at [phone] opt 5

## 2018-08-03 NOTE — Telephone Encounter (Signed)
I think her spasms are chronic Check with her---I think they are likely to not approve the cyclobenzaprine for long term use. She may want to check on the cash price, or we can consider changing to a different muscle relaxer (that would be consider safer in older individuals)

## 2018-08-04 ENCOUNTER — Telehealth: Payer: Self-pay | Admitting: Internal Medicine

## 2018-08-04 NOTE — Telephone Encounter (Signed)
Anita @ [employer] number [phone] option 5  Medication request was not approved for  cyclobenzapine  

## 2018-08-07 NOTE — Telephone Encounter (Signed)
Per Carrie Mew other MEssage 08-04-18 11:17am: Synetta Fail @ [employer] number [phone] option 5  Medication request was not approved for  cyclobenzapine

## 2018-08-07 NOTE — Telephone Encounter (Signed)
Synetta Fail @ [employer] number [phone] option 5  Medication request was not approved for  cyclobenzapine

## 2018-08-07 NOTE — Telephone Encounter (Signed)
Spoke to pt. Advised her that she could get it at [facility] on the $4 plan or to look at [facility]. She was fine with that.

## 2018-08-24 ENCOUNTER — Ambulatory Visit (INDEPENDENT_AMBULATORY_CARE_PROVIDER_SITE_OTHER): Payer: Medicare Other | Admitting: Internal Medicine

## 2018-08-24 ENCOUNTER — Other Ambulatory Visit: Payer: Self-pay

## 2018-08-24 ENCOUNTER — Encounter: Payer: Self-pay | Admitting: Internal Medicine

## 2018-08-24 DIAGNOSIS — G8929 Other chronic pain: Secondary | ICD-10-CM | POA: Diagnosis not present

## 2018-08-24 DIAGNOSIS — F1120 Opioid dependence, uncomplicated: Secondary | ICD-10-CM | POA: Diagnosis not present

## 2018-08-24 DIAGNOSIS — J449 Chronic obstructive pulmonary disease, unspecified: Secondary | ICD-10-CM

## 2018-08-24 DIAGNOSIS — M545 Low back pain, unspecified: Secondary | ICD-10-CM

## 2018-08-24 MED ORDER — HYDROCODONE-ACETAMINOPHEN 10-325 MG PO TABS: 1.0000 | ORAL_TABLET | Freq: Two times a day (BID) | ORAL | 0 refills | Status: DC | PRN

## 2018-08-24 NOTE — Assessment & Plan Note (Signed)
Still smoking--discussed cessation but she doesn't seem ready to stop Counseled on being especially careful with social distancing, etc

## 2018-08-24 NOTE — Assessment & Plan Note (Signed)
Usually worse this time of year due to increase in her work Hasn't been able to do much due to COVID though No functional changes

## 2018-08-24 NOTE — Telephone Encounter (Signed)
Name of Medication: Hydrocodone Name of Pharmacy: [facility] or Written Date and Quantity: 07-26-18 #60 Last Office Visit and Type: 3 month F/U 05-29-18 Next Office Visit and Type: 3 Month F/U today @ 245 Last Controlled Substance Agreement Date:  05-18-17 Last UDS: 05-18-17

## 2018-08-24 NOTE — Assessment & Plan Note (Signed)
PDMP review done No concerns

## 2018-08-24 NOTE — Progress Notes (Signed)
Subjective:    Patient ID: Catherine Griffin, female    DOB: 1955-08-11, 63 y.o.   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 aspirin 81 MG tablet Take 81 mg by mouth daily.    . cholecalciferol (VITAMIN D) 1000 UNITS tablet Take 1,000 Units by mouth daily.    . cyclobenzaprine (FLEXERIL) 10 MG tablet 1/2 TO 1 TABLET BY MOUTH AT BEDTIME AS NEEDED. 30 tablet 3  . HYDROcodone-acetaminophen (NORCO) 10-325 MG tablet Take 1 tablet by mouth 2 (two) times daily as needed. 60 tablet 0  . Melatonin 5 MG TABS Take 1 tablet by mouth at bedtime.     No current facility-administered medications on file prior to visit.     Allergies  Allergen Reactions  . Codeine Phosphate [Codeine] Nausea Only  . Indomethacin     REACTION: Rash/hives/swelling    Past Medical History:  Diagnosis Date  . Abdominal pain   . Abdominal pain, right upper quadrant   . Actinic keratosis    04/25/07  . Aphthous ulcer of mouth   . Arthritis   . Back pain    04/25/07  . Chest pain    04/25/07  . Diverticulosis of colon    02/28/09  . Fatigue    05/30/08  . Hemorrhoid    12/01/10  . Hepatitis C   . Hyperlipidemia   . Hypothyroidism   . Lichen sclerosus   . Osteopenia   . Osteoporosis    04/25/07  . RUQ abdominal pain    02/07/09  . Spinal stenosis   . Thyroid disease    hypothyrodism  . Vaginitis    10/01/10  . Wears dentures    partial upper and lower     Past Surgical History:  Procedure Laterality Date  . ABDOMINAL HYSTERECTOMY    . COLONOSCOPY WITH PROPOFOL N/A 06/21/2017   Procedure: COLONOSCOPY WITH PROPOFOL;  Surgeon: Pasty Spillersahiliani, Varnita B, MD;  Location[facility];  Service: Endoscopy;  Laterality: N/A;  . ESOPHAGOGASTRODUODENOSCOPY (EGD) WITH PROPOFOL N/A 06/21/2017   Procedure: ESOPHAGOGASTRODUODENOSCOPY (EGD) WITH PROPOFOL;  Surgeon: Pasty Spillersahiliani, Varnita B, MD;  [facility];  Service: Endoscopy;  Laterality: N/A;  . TONSILLECTOMY      Family History  Problem Relation Age of Onset  . Heart disease Father   . Arthritis Father   . Other Father        joint replacement  . Cancer Mother        lung  . Anemia Sister   . Other Sister        CHF  . Breast cancer Sister [id number]  . Cancer Sister        1 sister died of metastatic cancer  . Congestive Heart Failure Sister   . Cancer Brother        lung cancer  . Coronary artery disease Neg Hx   . Diabetes Neg Hx  Social History   Socioeconomic History  . Marital status: Married    Spouse name: Not on file  . Number of children: Not on file  . Years of education: Not on file  . Highest education level: Not on file  Occupational History  . 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 Financial resource strain: Not on file  . Food insecurity:    Worry: Not on file    Inability: Not on file  . Transportation needs:    Medical: Not on file    Non-medical: Not on file  Tobacco Use  . Smoking status: Current Every Day Smoker    Packs/day: 1.00    Types: Cigarettes    Last attempt to quit: 04/27/2017    Years since quitting: 1.3  . Smokeless tobacco: Never Used  . Tobacco comment: since age 12  Substance and Sexual Activity  . Alcohol use: No    Alcohol/week: 0.0 standard drinks  . Drug use: No  . Sexual activity: Not on file  Lifestyle  . Physical activity:    Days per week: Not on file    Minutes  per session: Not on file  . Stress: Not on file  Relationships  . Social connections:    Talks on phone: Not on file    Gets together: Not on file    Attends religious service: Not on file    Active member of club or organization: Not on file    Attends meetings of clubs or organizations: Not on file    Relationship status: Not on file  . Intimate partner violence:    Fear of current or ex partner: Not on file    Emotionally abused: Not on file    Physically abused: Not on file    Forced sexual activity: Not on file  Other Topics Concern  . Not on file  Social History Narrative   2 step children   Review of Systems  No fever Sleeping fine     Objective:   Physical Exam  Constitutional: She appears well-developed. No distress.  Usual raspy voice--no change  Respiratory: Effort normal. No respiratory distress.  Psychiatric: She has a normal mood and affect. Her behavior is normal.           Assessment & Plan:

## 2018-08-25 ENCOUNTER — Telehealth: Payer: Self-pay | Admitting: Internal Medicine

## 2018-08-25 NOTE — Telephone Encounter (Signed)
I left msg for pt to schedule welcome to mcr in office exam by 8/10 at Dr Karle Starch request. Please verify home phone- it is for [employer], not a personal line.

## 2018-09-02 ENCOUNTER — Other Ambulatory Visit: Payer: Self-pay | Admitting: Internal Medicine

## 2018-09-05 NOTE — Telephone Encounter (Signed)
Last filled 05-01-18 #30 Last OV 08-07-18 Next OV 11-16-18 [facility]

## 2018-09-25 ENCOUNTER — Other Ambulatory Visit: Payer: Self-pay

## 2018-09-26 MED ORDER — HYDROCODONE-ACETAMINOPHEN 10-325 MG PO TABS: 1.0000 | ORAL_TABLET | Freq: Two times a day (BID) | ORAL | 0 refills | Status: DC | PRN

## 2018-09-26 NOTE — Telephone Encounter (Signed)
Name of Medication:  Hydrocodone Name of [facility] or Written Date and Quantity: 08-24-18 #60 Last Office Visit and Type: 08-24-18 3 Month F/U Next Office Visit and Type: 11-27-18 3 Month F/U Last Controlled Substance Agreement Date: 05-18-17 Last UDS: 05-18-17

## 2018-10-24 ENCOUNTER — Other Ambulatory Visit: Payer: Self-pay

## 2018-10-24 NOTE — Telephone Encounter (Signed)
Name of Medication:  Hydrocodone Name of [facility]acy[facility] Written Date and Quantity: 09-25-18 #60 Last Office Visit and Type: 08-24-18 3 Month F/U Next Office Visit and Type: 11-27-18 3 Month F/U Last Controlled Substance Agreement Date: 05-18-17 Last UDS: 05-18-17

## 2018-10-25 MED ORDER — HYDROCODONE-ACETAMINOPHEN 10-325 MG PO TABS: 1.0000 | ORAL_TABLET | Freq: Two times a day (BID) | ORAL | 0 refills | Status: DC | PRN

## 2018-11-24 ENCOUNTER — Other Ambulatory Visit: Payer: Self-pay

## 2018-11-24 MED ORDER — HYDROCODONE-ACETAMINOPHEN 10-325 MG PO TABS: 1.0000 | ORAL_TABLET | Freq: Two times a day (BID) | ORAL | 0 refills | Status: DC | PRN

## 2018-11-24 NOTE — Telephone Encounter (Signed)
Name of Medication: Hydrocodone Name of [facility] Written Date and Quantity: 10-25-18 #60 Last Office Visit and Type: 08-24-18 Next Office Visit and Type: 11-27-18 Last Controlled Substance Agreement Date: 05-18-17 Last UDS: 05-18-17

## 2018-11-27 ENCOUNTER — Encounter: Payer: Self-pay | Admitting: Internal Medicine

## 2018-11-27 ENCOUNTER — Other Ambulatory Visit: Payer: Self-pay

## 2018-11-27 ENCOUNTER — Ambulatory Visit (INDEPENDENT_AMBULATORY_CARE_PROVIDER_SITE_OTHER): Payer: Medicare Other | Admitting: Internal Medicine

## 2018-11-27 VITALS — BP 120/80 | HR 87 | Temp 98.5°F | Ht 63.0 in | Wt 141.0 lb

## 2018-11-27 DIAGNOSIS — M5442 Lumbago with sciatica, left side: Secondary | ICD-10-CM

## 2018-11-27 DIAGNOSIS — F17200 Nicotine dependence, unspecified, uncomplicated: Secondary | ICD-10-CM

## 2018-11-27 DIAGNOSIS — M5441 Lumbago with sciatica, right side: Secondary | ICD-10-CM

## 2018-11-27 DIAGNOSIS — F1120 Opioid dependence, uncomplicated: Secondary | ICD-10-CM | POA: Diagnosis not present

## 2018-11-27 DIAGNOSIS — G8929 Other chronic pain: Secondary | ICD-10-CM

## 2018-11-27 MED ORDER — HYDROCODONE-ACETAMINOPHEN 10-325 MG PO TABS: 1.0000 | ORAL_TABLET | Freq: Three times a day (TID) | ORAL | 0 refills | Status: DC | PRN

## 2018-11-27 NOTE — Progress Notes (Signed)
Subjective:    Patient ID: Catherine Griffin, female    DOB: [birth year], 63 y.o.   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 aspirin 81 MG tablet Take 81 mg by mouth daily.    . cholecalciferol (VITAMIN D) 1000 UNITS tablet Take 1,000 Units by mouth daily.    . cyclobenzaprine (FLEXERIL) 10 MG tablet TAKE 1/2 TO 1 TABLET BY MOUTH AT BEDTIME AS NEEDED. 30 tablet 3  . HYDROcodone-acetaminophen (NORCO) 10-325 MG tablet Take 1 tablet by mouth 2 (two) times daily as needed. 60 tablet 0  . Melatonin 5 MG TABS Take 1 tablet by mouth at bedtime.     No current facility-administered medications on file prior to visit.     Allergies  Allergen Reactions  . Codeine Phosphate [Codeine] Nausea Only  . Indomethacin     REACTION: Rash/hives/swelling    Past Medical History:  Diagnosis Date  . Abdominal pain   . Abdominal pain, right upper quadrant   . Actinic keratosis    04/25/07  . Aphthous ulcer of mouth   . Arthritis   . Back pain    04/25/07  . Chest pain    04/25/07  . Diverticulosis of colon    02/28/09  . Fatigue    05/30/08  . Hemorrhoid    12/01/10  . Hepatitis C   . Hyperlipidemia   . Hypothyroidism   . Lichen sclerosus   . Osteopenia   . Osteoporosis    04/25/07  . RUQ abdominal pain    02/07/09  . Spinal stenosis   . Thyroid disease    hypothyrodism  . Vaginitis    10/01/10  . Wears dentures    partial upper and lower     Past Surgical History:  Procedure Laterality Date  . ABDOMINAL HYSTERECTOMY    . COLONOSCOPY WITH PROPOFOL N/A 06/21/2017   Procedure: COLONOSCOPY WITH PROPOFOL;  Surgeon: Pasty Spillersahiliani, Varnita B, MD;  [facility];  Service: Endoscopy;  Laterality: N/A;  . ESOPHAGOGASTRODUODENOSCOPY (EGD) WITH PROPOFOL N/A 06/21/2017   Procedure: ESOPHAGOGASTRODUODENOSCOPY (EGD) WITH PROPOFOL;  Surgeon: Pasty Spillersahiliani, Varnita B, MD;  [facility];  Service: Endoscopy;  Laterality: N/A;  . TONSILLECTOMY      Family History  Problem Relation Age of Onset  . Heart disease Father   . Arthritis Father   . Other Father        joint replacement  . Cancer Mother        lung  . Anemia Sister   . Other Sister        CHF  . Breast cancer Sister [id number]  . Cancer Sister        1 sister died of metastatic cancer  . Congestive Heart Failure Sister   . Cancer Brother        lung cancer  . Coronary artery disease Neg Hx   . Diabetes Neg Hx  Social History   Socioeconomic History  . Marital status: Married    Spouse name: Not on file  . Number of children: Not on file  . Years of education: Not on file  . Highest education level: Not on file  Occupational History  . 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 Financial resource strain: Not on file  . Food insecurity    Worry: Not on file    Inability: Not on file  . Transportation needs    Medical: Not on file    Non-medical: Not on file  Tobacco Use  . Smoking status: Current Every Day Smoker    Packs/day: 1.00    Types: Cigarettes    Last attempt to quit: 04/27/2017    Years since quitting: 1.5  . Smokeless tobacco: Never Used  . Tobacco comment: since age 70  Substance and Sexual Activity  . Alcohol use: No    Alcohol/week: 0.0 standard drinks  . Drug use: No  . Sexual activity: Not on file  Lifestyle  . Physical activity    Days per week: Not on file    Minutes per  session: Not on file  . Stress: Not on file  Relationships  . Social Herbalist on phone: Not on file    Gets together: Not on file    Attends religious service: Not on file    Active member of club or organization: Not on file    Attends meetings of clubs or organizations: Not on file    Relationship status: Not on file  . Intimate partner violence    Fear of current or ex partner: Not on file    Emotionally abused: Not on file    Physically abused: Not on file    Forced sexual activity: Not on file  Other Topics Concern  . Not on file  Social History Narrative   2 step children   Review of Systems More cough This is "bugging me" so prompted Catherine Griffin current quit attempt Appetite is okay Weight stable New dog---puppy. Runs with Catherine Griffin some    Objective:   Physical Exam  Constitutional: She appears well-developed. No distress.  Psychiatric: She has a normal mood and affect. Catherine Griffin behavior is normal.           Assessment & Plan:

## 2018-11-27 NOTE — Assessment & Plan Note (Signed)
PDMP reviewed No concerns 

## 2018-11-27 NOTE — Patient Instructions (Signed)
You need to get a core strengthening work out ---that doesn't involve dynamic stretching (to protect your back)

## 2018-11-27 NOTE — Assessment & Plan Note (Signed)
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

## 2018-11-27 NOTE — Assessment & Plan Note (Signed)
Has stopped for 2 days Discussed patch plus nicotine lozenge Extended patch okay if needed

## 2018-11-30 LAB — PAIN MGMT, PROFILE 8 W/CONF, U
6 Acetylmorphine: NEGATIVE ng/mL
Alcohol Metabolites: NEGATIVE ng/mL (ref <500)
Amphetamines: NEGATIVE ng/mL
Benzodiazepines: NEGATIVE ng/mL
Buprenorphine, Urine: NEGATIVE ng/mL
Cocaine Metabolite: NEGATIVE ng/mL
Codeine: NEGATIVE ng/mL
Creatinine: 44.7 mg/dL
Hydrocodone: 1466 ng/mL
Hydromorphone: 823 ng/mL
MDMA: NEGATIVE ng/mL
Marijuana Metabolite: NEGATIVE ng/mL
Morphine: NEGATIVE ng/mL
Norhydrocodone: 2369 ng/mL
Opiates: POSITIVE ng/mL
Oxidant: NEGATIVE ug/mL
Oxycodone: NEGATIVE ng/mL
pH: 5.3 (ref 4.5–9.0)

## 2018-12-18 ENCOUNTER — Other Ambulatory Visit: Payer: Self-pay

## 2018-12-18 ENCOUNTER — Other Ambulatory Visit: Payer: Self-pay | Admitting: Internal Medicine

## 2018-12-20 NOTE — Telephone Encounter (Signed)
Last filled 11/27/2018... too soon

## 2018-12-20 NOTE — Telephone Encounter (Signed)
Flexeril last filled 09/05/2018 # 30 with 3 refills Last OV 11/27/2018 [facility] to [facility] visit scheduled [facility] 01/2019 [facility]  Please advise

## 2018-12-21 MED ORDER — HYDROCODONE-ACETAMINOPHEN 10-325 MG PO TABS: 1.0000 | ORAL_TABLET | Freq: Three times a day (TID) | ORAL | 0 refills | Status: DC | PRN

## 2019-01-18 ENCOUNTER — Other Ambulatory Visit: Payer: Self-pay

## 2019-01-18 ENCOUNTER — Encounter: Payer: Self-pay | Admitting: Internal Medicine

## 2019-01-18 ENCOUNTER — Ambulatory Visit (INDEPENDENT_AMBULATORY_CARE_PROVIDER_SITE_OTHER): Payer: Medicare Other | Admitting: Internal Medicine

## 2019-01-18 VITALS — BP 122/74 | HR 83 | Temp 97.8°F | Ht 62.25 in | Wt 144.0 lb

## 2019-01-18 DIAGNOSIS — F1120 Opioid dependence, uncomplicated: Secondary | ICD-10-CM

## 2019-01-18 DIAGNOSIS — Z23 Encounter for immunization: Secondary | ICD-10-CM | POA: Diagnosis not present

## 2019-01-18 DIAGNOSIS — Z0000 Encounter for general adult medical examination without abnormal findings: Secondary | ICD-10-CM

## 2019-01-18 DIAGNOSIS — J410 Simple chronic bronchitis: Secondary | ICD-10-CM | POA: Diagnosis not present

## 2019-01-18 DIAGNOSIS — M549 Dorsalgia, unspecified: Secondary | ICD-10-CM | POA: Diagnosis not present

## 2019-01-18 DIAGNOSIS — G8929 Other chronic pain: Secondary | ICD-10-CM | POA: Diagnosis not present

## 2019-01-18 DIAGNOSIS — Z7189 Other specified counseling: Secondary | ICD-10-CM

## 2019-01-18 MED ORDER — HYDROCODONE-ACETAMINOPHEN 10-325 MG PO TABS: 1.0000 | ORAL_TABLET | Freq: Three times a day (TID) | ORAL | 0 refills | Status: DC | PRN

## 2019-01-18 NOTE — Progress Notes (Signed)
Hearing Screening   Method: Audiometry   125Hz 250Hz 500Hz 1000Hz 2000Hz 3000Hz 4000Hz 6000Hz 8000Hz  Right ear:   20 20 20  20    Left ear:   20 20 20  20      Visual Acuity Screening   Right eye Left eye Both eyes  Without correction: 20/20 20/25 20/20  With correction:       

## 2019-01-18 NOTE — Assessment & Plan Note (Signed)
See social history 

## 2019-01-18 NOTE — Progress Notes (Signed)
Subjective:    Patient ID: Catherine Griffin, female    DOB: 07-08-1955, 63 y.o.   MRN: [id number]  HPI Here for initial Medicare preventative visit and follow up of chronic health conditions Reviewed form and advanced directives Reviewed other doctors Still smokes---discussed No alcohol Does do regular exercise and stays active Vision and hearing are fine No falls No depression or anhedonia Independent with instrumental ADLs No memory problems  Some cough--sputum No apparent SOB Will have some DOE---if she runs with her puppy Occasionally awakens herself wheezing  Back pain is variable---mostly worsens after strain at work Occasional radiation to legs and feet No leg weakness  Current Outpatient Medications on File Prior to Visit  Medication Sig Dispense Refill  . aspirin 81 MG tablet Take 81 mg by mouth daily.    . cholecalciferol (VITAMIN D) 1000 UNITS tablet Take 1,000 Units by mouth daily.    . cyclobenzaprine (FLEXERIL) 10 MG tablet TAKE 1/2 TO 1 TABLET BY MOUTH AT BEDTIME AS NEEDED 30 tablet 1  . HYDROcodone-acetaminophen (NORCO) 10-325 MG tablet Take 1 tablet by mouth 3 (three) times daily as needed. 75 tablet 0  . Melatonin 5 MG TABS Take 1 tablet by mouth at bedtime.     No current facility-administered medications on file prior to visit.     Allergies  Allergen Reactions  . Codeine Phosphate [Codeine] Nausea Only  . Indomethacin     REACTION: Rash/hives/swelling    Past Medical History:  Diagnosis Date  . Abdominal pain   . Abdominal pain, right upper quadrant   . Actinic keratosis    04/25/07  . Aphthous ulcer of mouth   . Arthritis   . Back pain    04/25/07  . Chest pain    04/25/07  . Diverticulosis of colon    02/28/09  . Fatigue    05/30/08  . Hemorrhoid    12/01/10  . Hepatitis C   . Hyperlipidemia   . Hypothyroidism   . Lichen sclerosus   . Osteopenia   . Osteoporosis    04/25/07  . RUQ abdominal pain    02/07/09  . Spinal  stenosis   . Thyroid disease    hypothyrodism  . Vaginitis    10/01/10  . Wears dentures    partial upper and lower    Past Surgical History:  Procedure Laterality Date  . ABDOMINAL HYSTERECTOMY    . COLONOSCOPY WITH PROPOFOL N/A 06/21/2017   Procedure: COLONOSCOPY WITH PROPOFOL;  Surgeon: Pasty Spillers, MD;  Location: [facility];  Service: Endoscopy;  Laterality: N/A;  . ESOPHAGOGASTRODUODENOSCOPY (EGD) WITH PROPOFOL N/A 06/21/2017   Procedure: ESOPHAGOGASTRODUODENOSCOPY (EGD) WITH PROPOFOL;  Surgeon: Pasty Spillers, MD;  Location: [facility];  Service: Endoscopy;  Laterality: N/A;  . TONSILLECTOMY      Family History  Problem Relation Age of Onset  . Heart disease Father   . Arthritis Father   . Other Father        joint replacement  . Cancer Mother        lung  . Anemia Sister   . Other Sister        CHF  . Breast cancer Sister 66  . Cancer Sister        1 sister died of metastatic cancer  . Congestive Heart Failure Sister   . Cancer Brother        lung cancer  . Coronary artery disease Neg Hx   . Diabetes Neg  Hx     Social History   Socioeconomic History  . Marital status: Married    Spouse name: Not on file  . Number of children: Not on file  . Years of education: Not on file  . Highest education level: Not on file  Occupational History  . 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 Financial resource strain: Not on file  . Food insecurity    Worry: Not on file    Inability: Not on file  . Transportation needs    Medical: Not on file    Non-medical: Not on file  Tobacco Use  . Smoking status: Current Every Day Smoker    Packs/day: 1.00    Types: Cigarettes    Last attempt to quit: 04/27/2017    Years since quitting: 1.7  . Smokeless tobacco: Never Used  . Tobacco comment: since age 63  Substance and Sexual Activity  . Alcohol use: No    Alcohol/week: 0.0 standard drinks  . Drug  use: No  . Sexual activity: Not on file  Lifestyle  . Physical activity    Days per week: Not on file    Minutes per session: Not on file  . Stress: Not on file  Relationships  . Social Herbalist on phone: Not on file    Gets together: Not on file    Attends religious service: Not on file    Active member of club or organization: Not on file    Attends meetings of clubs or organizations: Not on file    Relationship status: Not on file  . Intimate partner violence    Fear of current or ex partner: Not on file    Emotionally abused: Not on file    Physically abused: Not on file    Forced sexual activity: Not on file  Other Topics Concern  . Not on file  Social History Narrative   2 step children      Has living will   Health care POA is husband---sister [name] would be alternate   Would accept resuscitation   No prolonged tube feeds if cognitively unaware   Review of Systems  appetite is good Weight stable Sleeps fairly well Wears seat belt Teeth fine--keeps up No skin rash or suspicious lesions No chest pain or palpitations No dizziness or syncope No heartburn or dysphagia Bowels are good--no blood Voids fine--no incontinence    Objective:   Physical Exam  Constitutional: She is oriented to person, place, and time. She appears well-developed. No distress.  HENT:  Mouth/Throat: Oropharynx is clear and moist. No oropharyngeal exudate.  Neck: No thyromegaly present.  Cardiovascular: Normal rate, regular rhythm, normal heart sounds and intact distal pulses. Exam reveals no gallop.  No murmur heard. Respiratory: Effort normal. No respiratory distress. She has no wheezes. She has no rales.  Decreased breath sounds but clear  GI: Soft. There is no abdominal tenderness.  Musculoskeletal:        General: No tenderness or edema.  Lymphadenopathy:    She has no cervical adenopathy.  Neurological: She is alert and oriented to person, place, and time.   President---"Trump, Obama, Bush" [id number] Recall 2/3  Skin: No rash noted. No erythema.  Psychiatric: She has a normal mood and affect. Her behavior is normal.           Assessment & Plan:

## 2019-01-18 NOTE — Assessment & Plan Note (Signed)
No concerns on PDMP 

## 2019-01-18 NOTE — Addendum Note (Signed)
Addended by: Pilar Grammes on: 01/18/2019 04:22 PM   Modules accepted: Orders

## 2019-01-18 NOTE — Assessment & Plan Note (Signed)
I have personally reviewed the Medicare Annual Wellness questionnaire and have noted 1. The patient's medical and social history 2. Their use of alcohol, tobacco or illicit drugs 3. Their current medications and supplements 4. The patient's functional ability including ADL's, fall risks, home safety risks and hearing or visual             impairment. 5. Diet and physical activities 6. Evidence for depression or mood disorders  The patients weight, height, BMI and visual acuity have been recorded in the chart I have made referrals, counseling and provided education to the patient based review of the above and I have provided the pt with a written personalized care plan for preventive services.  I have provided you with a copy of your personalized plan for preventive services. Please take the time to review along with your updated medication list.  Colon due 2029 Mammogram due anytime now No pap due to hyster Discussed exercise prevnar and flu vaccines today Recommended shingrix at pharmacy

## 2019-01-18 NOTE — Assessment & Plan Note (Signed)
Again discussed cutting back or stopping smoking No Rx needed yet Discussed lung cancer screening--she is not interested at this time

## 2019-01-18 NOTE — Assessment & Plan Note (Signed)
Remains functional No leg weakness

## 2019-01-19 LAB — CBC
HCT: 42 % (ref 36.0–46.0)
Hemoglobin: 14.1 g/dL (ref 12.0–15.0)
MCHC: 33.7 g/dL (ref 30.0–36.0)
MCV: 94 fl (ref 78.0–100.0)
Platelets: 224 10*3/uL (ref 150.0–400.0)
RBC: 4.46 Mil/uL (ref 3.87–5.11)
RDW: 14.6 % (ref 11.5–15.5)
WBC: 8.5 10*3/uL (ref 4.0–10.5)

## 2019-01-19 LAB — COMPREHENSIVE METABOLIC PANEL
ALT: 8 U/L (ref 0–35)
AST: 18 U/L (ref 0–37)
Albumin: 4.1 g/dL (ref 3.5–5.2)
Alkaline Phosphatase: 67 U/L (ref 39–117)
BUN: 21 mg/dL (ref 6–23)
CO2: 29 mEq/L (ref 19–32)
Calcium: 9.2 mg/dL (ref 8.4–10.5)
Chloride: 103 mEq/L (ref 96–112)
Creatinine, Ser: 0.83 mg/dL (ref 0.40–1.20)
GFR: 68.9 mL/min (ref >60.00)
Glucose, Bld: 84 mg/dL (ref 70–99)
Potassium: 4.1 mEq/L (ref 3.5–5.1)
Sodium: 138 mEq/L (ref 135–145)
Total Bilirubin: 0.2 mg/dL (ref 0.2–1.2)
Total Protein: 7.4 g/dL (ref 6.0–8.3)

## 2019-02-16 ENCOUNTER — Other Ambulatory Visit: Payer: Self-pay

## 2019-02-16 MED ORDER — HYDROCODONE-ACETAMINOPHEN 10-325 MG PO TABS: 1.0000 | ORAL_TABLET | Freq: Three times a day (TID) | ORAL | 0 refills | Status: DC | PRN

## 2019-02-16 NOTE — Telephone Encounter (Signed)
Name of Medication: Hydrocodone Name of [facility]acy: [facility] or Written Date and Quantity: 01-18-19 #75 Last Office Visit and Type: 01-18-19 Next Office Visit and Type: 04-20-19 Last Controlled Substance Agreement Date: 11-27-18 Last UDS: 11-27-18

## 2019-02-17 ENCOUNTER — Other Ambulatory Visit: Payer: Self-pay | Admitting: Internal Medicine

## 2019-02-19 NOTE — Telephone Encounter (Signed)
Last filled 01-13-19 #30 Last OV 01-18-19 Next OV 04-20-19 [facility]

## 2019-03-16 ENCOUNTER — Other Ambulatory Visit: Payer: Self-pay

## 2019-03-16 MED ORDER — HYDROCODONE-ACETAMINOPHEN 10-325 MG PO TABS: 1.0000 | ORAL_TABLET | Freq: Three times a day (TID) | ORAL | 0 refills | Status: DC | PRN

## 2019-03-16 NOTE — Telephone Encounter (Signed)
Name of Medication: Hydrocodone Name of [facility]acy: [facility] or Written Date and Quantity: 02-16-19 #75 Last Office Visit and Type: 01-18-19 Next Office Visit and Type: 04-20-19 Last Controlled Substance Agreement Date: 11-27-18 Last UDS: 11-27-18

## 2019-04-10 ENCOUNTER — Other Ambulatory Visit: Payer: Self-pay | Admitting: Internal Medicine

## 2019-04-14 ENCOUNTER — Other Ambulatory Visit: Payer: Self-pay

## 2019-04-16 MED ORDER — HYDROCODONE-ACETAMINOPHEN 10-325 MG PO TABS: 1.0000 | ORAL_TABLET | Freq: Three times a day (TID) | ORAL | 0 refills | Status: DC | PRN

## 2019-04-16 NOTE — Telephone Encounter (Signed)
Name of Medication: Hydrocodone Name of [facility]acy: [facility] or Written Date and Quantity: 03-16-19 #75 Last Office Visit and Type: 01-18-19 Next Office Visit and Type: 04-20-19 Last Controlled Substance Agreement Date: 11-27-18 Last UDS: 11-27-18

## 2019-04-17 NOTE — Telephone Encounter (Signed)
Please change this to a virtual visit

## 2019-04-19 NOTE — Telephone Encounter (Signed)
Catherine Griffin, this person has a new insurance. She has a Virtual visit with Dr Alphonsus Sias tomorrow (Friday 8th). She has attached a copy of her new card. Can you put it in? Thanks in advance.

## 2019-04-20 ENCOUNTER — Encounter: Payer: Self-pay | Admitting: Internal Medicine

## 2019-04-20 ENCOUNTER — Ambulatory Visit (INDEPENDENT_AMBULATORY_CARE_PROVIDER_SITE_OTHER): Payer: Medicare HMO | Admitting: Internal Medicine

## 2019-04-20 ENCOUNTER — Other Ambulatory Visit: Payer: Self-pay

## 2019-04-20 DIAGNOSIS — F1120 Opioid dependence, uncomplicated: Secondary | ICD-10-CM

## 2019-04-20 DIAGNOSIS — J410 Simple chronic bronchitis: Secondary | ICD-10-CM

## 2019-04-20 DIAGNOSIS — G8929 Other chronic pain: Secondary | ICD-10-CM

## 2019-04-20 DIAGNOSIS — M549 Dorsalgia, unspecified: Secondary | ICD-10-CM

## 2019-04-20 NOTE — Assessment & Plan Note (Signed)
Sometimes actually worse if she isn't working much Trying to stay loose Continues on the same medications

## 2019-04-20 NOTE — Progress Notes (Signed)
Subjective:    Patient ID: Catherine Griffin, female    DOB: [birth year], 64 y.o.   MRN: [id number]  HPI Video virtual visit for review of chronic pain and narcotic dependence Identification done Reviewed billing and she gave consent Participants---patient in her home, I am in my office  She is doing okay Back pain is about the same Quiet time of the year for her Sometimes sitting around is worse on her back Continues the hydrocodone and flexeril (mostly just at bedtime)  Quit smoking 2 weeks ago No increased cough now---was very bad before quitting and now is fine Voice is the same No SOB Using the patch  Current Outpatient Medications on File Prior to Visit  Medication Sig Dispense Refill  . aspirin 81 MG tablet Take 81 mg by mouth daily.    . cholecalciferol (VITAMIN D) 1000 UNITS tablet Take 1,000 Units by mouth daily.    . cyclobenzaprine (FLEXERIL) 10 MG tablet TAKE 1/2 TO 1 TABLET BY MOUTH AT BEDTIME AS NEEDED 30 tablet 1  . HYDROcodone-acetaminophen (NORCO) 10-325 MG tablet Take 1 tablet by mouth 3 (three) times daily as needed. 75 tablet 0  . Melatonin 5 MG TABS Take 1 tablet by mouth at bedtime.    . Multiple Vitamin (MULTIVITAMIN) capsule Take 1 capsule by mouth daily.     No current facility-administered medications on file prior to visit.    Allergies  Allergen Reactions  . Codeine Phosphate [Codeine] Nausea Only  . Indomethacin     REACTION: Rash/hives/swelling    Past Medical History:  Diagnosis Date  . Abdominal pain   . Abdominal pain, right upper quadrant   . Actinic keratosis    04/25/07  . Aphthous ulcer of mouth   . Arthritis   . Back pain    04/25/07  . Chest pain    04/25/07  . Diverticulosis of colon    02/28/09  . Fatigue    05/30/08  . Hemorrhoid    12/01/10  . Hepatitis C   . Hyperlipidemia   . Hypothyroidism   . Lichen sclerosus   . Osteopenia   . Osteoporosis    04/25/07  . RUQ abdominal pain    02/07/09  . Spinal stenosis    . Thyroid disease    hypothyrodism  . Vaginitis    10/01/10  . Wears dentures    partial upper and lower    Past Surgical History:  Procedure Laterality Date  . ABDOMINAL HYSTERECTOMY    . COLONOSCOPY WITH PROPOFOL N/A 06/21/2017   Procedure: COLONOSCOPY WITH PROPOFOL;  Surgeon: Virgel Manifold, MD;  Location: [place];  Service: Endoscopy;  Laterality: N/A;  . ESOPHAGOGASTRODUODENOSCOPY (EGD) WITH PROPOFOL N/A 06/21/2017   Procedure: ESOPHAGOGASTRODUODENOSCOPY (EGD) WITH PROPOFOL;  Surgeon: Virgel Manifold, MD;  Location: [place];  Service: Endoscopy;  Laterality: N/A;  . TONSILLECTOMY      Family History  Problem Relation Age of Onset  . Heart disease Father   . Arthritis Father   . Other Father        joint replacement  . Cancer Mother        lung  . Anemia Sister   . Other Sister        CHF  . Breast cancer Sister 21  . Cancer Sister        1 sister died of metastatic cancer  . Congestive Heart Failure Sister   . Cancer Brother  lung cancer  . Coronary artery disease Neg Hx   . Diabetes Neg Hx     Social History   Socioeconomic History  . Marital status: Married    Spouse name: Not on file  . Number of children: Not on file  . Years of education: Not on file  . Highest education level: Not on file  Occupational History  . 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 Smoking status: Current Every Day Smoker    Packs/day: 1.00    Types: Cigarettes    Last attempt to quit: 04/27/2017    Years since quitting: 1.9  . Smokeless tobacco: Never Used  . Tobacco comment: since age 36  Substance and Sexual Activity  . Alcohol use: No    Alcohol/week: 0.0 standard drinks  . Drug use: No  . Sexual activity: Not on file  Other Topics Concern  . Not on file  Social History Narrative   2 step children      Has living will   Health care POA is husband---sister [name] would be alternate    Would accept resuscitation   No prolonged tube feeds if cognitively unaware   Social Determinants of Health   Financial Resource Strain:   . Difficulty of Paying Living Expenses: Not on file  Food Insecurity:   . Worried About Programme researcher, broadcasting/film/video in the Last Year: Not on file  . Ran Out of Food in the Last Year: Not on file  Transportation Needs:   . Lack of Transportation (Medical): Not on file  . Lack of Transportation (Non-Medical): Not on file  Physical Activity:   . Days of Exercise per Week: Not on file  . Minutes of Exercise per Session: Not on file  Stress:   . Feeling of Stress : Not on file  Social Connections:   . Frequency of Communication with Friends and Family: Not on file  . Frequency of Social Gatherings with Friends and Family: Not on file  . Attends Religious Services: Not on file  . Active Member of Clubs or Organizations: Not on file  . Attends Banker Meetings: Not on file  . Marital Status: Not on file  Intimate Partner Violence:   . Fear of Current or Ex-Partner: Not on file  . Emotionally Abused: Not on file  . Physically Abused: Not on file  . Sexually Abused: Not on file   Review of Systems  Sleeps okay with meds Appetite fine     Objective:   Physical Exam  Constitutional: She appears well-developed. No distress.  Respiratory: Effort normal. No respiratory distress.  No cough  Psychiatric: She has a normal mood and affect. Her behavior is normal.           Assessment & Plan:

## 2019-04-20 NOTE — Assessment & Plan Note (Signed)
Reviewed PDMP No concerns 

## 2019-04-20 NOTE — Assessment & Plan Note (Signed)
Stopped smoking 2 weeks ago. Congratulations! Discussed continuing the patch as long as necessary

## 2019-05-15 ENCOUNTER — Other Ambulatory Visit: Payer: Self-pay

## 2019-05-15 MED ORDER — HYDROCODONE-ACETAMINOPHEN 10-325 MG PO TABS: 1.0000 | ORAL_TABLET | Freq: Three times a day (TID) | ORAL | 0 refills | Status: DC | PRN

## 2019-05-15 NOTE — Telephone Encounter (Signed)
Name of Medication: Hydrocodone Name of [facility]acy: [facility] or Written Date and Quantity: 04-14-19 #75 Last Office Visit and Type: 04-20-19 Next Office Visit and Type: 07-20-19 Last Controlled Substance Agreement Date: 11-27-18 Last UDS: 11-27-18

## 2019-06-12 ENCOUNTER — Other Ambulatory Visit: Payer: Self-pay

## 2019-06-12 ENCOUNTER — Other Ambulatory Visit: Payer: Self-pay | Admitting: Internal Medicine

## 2019-06-12 MED ORDER — HYDROCODONE-ACETAMINOPHEN 10-325 MG PO TABS: 1.0000 | ORAL_TABLET | Freq: Three times a day (TID) | ORAL | 0 refills | Status: DC | PRN

## 2019-06-12 NOTE — Telephone Encounter (Signed)
Name of Medication:Hydrocodone Name of Pharmacy:[facility] or Written Date and Quantity:05-15-19 #75 Last Office Visit and Type:04-20-19 Next Office Visit and Type:07-20-19 Last Controlled Substance Agreement Date:11-27-18 Last UDS:11-27-18

## 2019-07-10 NOTE — Telephone Encounter (Signed)
I left a detailed message for patient to call back and reschedule appointment.

## 2019-07-11 ENCOUNTER — Other Ambulatory Visit: Payer: Self-pay

## 2019-07-11 ENCOUNTER — Other Ambulatory Visit: Payer: Self-pay | Admitting: Internal Medicine

## 2019-07-11 ENCOUNTER — Encounter: Payer: Self-pay | Admitting: Internal Medicine

## 2019-07-11 ENCOUNTER — Ambulatory Visit (INDEPENDENT_AMBULATORY_CARE_PROVIDER_SITE_OTHER): Payer: Medicare HMO | Admitting: Internal Medicine

## 2019-07-11 VITALS — BP 110/70 | Temp 97.3°F | Ht 62.0 in | Wt 152.0 lb

## 2019-07-11 DIAGNOSIS — M549 Dorsalgia, unspecified: Secondary | ICD-10-CM | POA: Diagnosis not present

## 2019-07-11 DIAGNOSIS — R5383 Other fatigue: Secondary | ICD-10-CM | POA: Diagnosis not present

## 2019-07-11 DIAGNOSIS — G8929 Other chronic pain: Secondary | ICD-10-CM | POA: Diagnosis not present

## 2019-07-11 DIAGNOSIS — F1120 Opioid dependence, uncomplicated: Secondary | ICD-10-CM

## 2019-07-11 LAB — COMPREHENSIVE METABOLIC PANEL
ALT: 13 U/L (ref 0–35)
AST: 22 U/L (ref 0–37)
Albumin: 4.2 g/dL (ref 3.5–5.2)
Alkaline Phosphatase: 50 U/L (ref 39–117)
BUN: 24 mg/dL — ABNORMAL HIGH (ref 6–23)
CO2: 31 mEq/L (ref 19–32)
Calcium: 9.1 mg/dL (ref 8.4–10.5)
Chloride: 103 mEq/L (ref 96–112)
Creatinine, Ser: 0.93 mg/dL (ref 0.40–1.20)
GFR: 60.33 mL/min (ref >60.00)
Glucose, Bld: 79 mg/dL (ref 70–99)
Potassium: 3.9 mEq/L (ref 3.5–5.1)
Sodium: 141 mEq/L (ref 135–145)
Total Bilirubin: 0.2 mg/dL (ref 0.2–1.2)
Total Protein: 6.8 g/dL (ref 6.0–8.3)

## 2019-07-11 LAB — SEDIMENTATION RATE: Sed Rate: 8 mm/hr (ref 0–30)

## 2019-07-11 LAB — CBC
HCT: 39.7 % (ref 36.0–46.0)
Hemoglobin: 13.3 g/dL (ref 12.0–15.0)
MCHC: 33.6 g/dL (ref 30.0–36.0)
MCV: 93.8 fl (ref 78.0–100.0)
Platelets: 193 10*3/uL (ref 150.0–400.0)
RBC: 4.23 Mil/uL (ref 3.87–5.11)
RDW: 14.9 % (ref 11.5–15.5)
WBC: 5.9 10*3/uL (ref 4.0–10.5)

## 2019-07-11 LAB — TSH: TSH: 2.64 u[IU]/mL (ref 0.35–4.50)

## 2019-07-11 LAB — VITAMIN B12: Vitamin B-12: 790 pg/mL (ref 211–911)

## 2019-07-11 LAB — T4, FREE: Free T4: 0.46 ng/dL — ABNORMAL LOW (ref 0.60–1.60)

## 2019-07-11 LAB — VITAMIN D 25 HYDROXY (VIT D DEFICIENCY, FRACTURES): VITD: 27.57 ng/mL — ABNORMAL LOW (ref 30.00–100.00)

## 2019-07-11 MED ORDER — HYDROCODONE-ACETAMINOPHEN 10-325 MG PO TABS: 1.0000 | ORAL_TABLET | Freq: Three times a day (TID) | ORAL | 0 refills | Status: DC | PRN

## 2019-07-11 NOTE — Telephone Encounter (Signed)
Name of Medication: Hydrocodone Name of Pharmacy: [facility] or Written Date and Quantity: 06-12-19 #75 Last Office Visit and Type: 04-20-19 Next Office Visit and Type: 07-11-19 (today) Last Controlled Substance Agreement Date: 11-27-18 Last UDS: 11-27-18

## 2019-07-11 NOTE — Assessment & Plan Note (Signed)
Vague symptoms Likely multifactorial---COVID, winter, no nicotine Nothing worrisome in history or PE Discussed limiting time in bed Will check labs

## 2019-07-11 NOTE — Assessment & Plan Note (Signed)
About the same Stays active Continues with the hydrocodone

## 2019-07-11 NOTE — Progress Notes (Signed)
Subjective:    Patient ID: Catherine Griffin, female    DOB: Mar 16, 1956, 64 y.o.   MRN: [id number]  HPI Here for chronic back pain and narcotic dependence This visit occurred during the SARS-CoV-2 public health emergency.  Safety protocols were in place, including screening questions prior to the visit, additional usage of staff PPE, and extensive cleaning of exam room while observing appropriate contact time as indicated for disinfecting solutions.   She feels bad Wonders if something wrong with her thyroid For 3 months, dry skin, gained weight "I just feel miserable" Stopped smoking 3 months ago  No chest pain No SOB Will feel heart beat if she lies on the left side No fever  Season for work will be starting soon  Pain is about the same No changes in hydrocodone dosing  Current Outpatient Medications on File Prior to Visit  Medication Sig Dispense Refill  . aspirin 81 MG tablet Take 81 mg by mouth daily.    . cholecalciferol (VITAMIN D) 1000 UNITS tablet Take 1,000 Units by mouth daily.    . cyclobenzaprine (FLEXERIL) 10 MG tablet TAKE 1/2 TO 1 TABLET BY MOUTH AT BEDTIME AS NEEDED 30 tablet 1  . HYDROcodone-acetaminophen (NORCO) 10-325 MG tablet Take 1 tablet by mouth 3 (three) times daily as needed. 75 tablet 0  . Melatonin 5 MG TABS Take 1 tablet by mouth at bedtime.    . Multiple Vitamin (MULTIVITAMIN) capsule Take 1 capsule by mouth daily.     No current facility-administered medications on file prior to visit.    Allergies  Allergen Reactions  . Codeine Phosphate [Codeine] Nausea Only  . Indomethacin     REACTION: Rash/hives/swelling    Past Medical History:  Diagnosis Date  . Abdominal pain   . Abdominal pain, right upper quadrant   . Actinic keratosis    04/25/07  . Aphthous ulcer of mouth   . Arthritis   . Back pain    04/25/07  . Chest pain    04/25/07  . Diverticulosis of colon    02/28/09  . Fatigue    05/30/08  . Hemorrhoid    12/01/10  .  Hepatitis C   . Hyperlipidemia   . Hypothyroidism   . Lichen sclerosus   . Osteopenia   . Osteoporosis    04/25/07  . RUQ abdominal pain    02/07/09  . Spinal stenosis   . Thyroid disease    hypothyrodism  . Vaginitis    10/01/10  . Wears dentures    partial upper and lower    Past Surgical History:  Procedure Laterality Date  . ABDOMINAL HYSTERECTOMY    . COLONOSCOPY WITH PROPOFOL N/A 06/21/2017   Procedure: COLONOSCOPY WITH PROPOFOL;  Surgeon: Pasty Spillers, MD;  Location: [facility];  Service: Endoscopy;  Laterality: N/A;  . ESOPHAGOGASTRODUODENOSCOPY (EGD) WITH PROPOFOL N/A 06/21/2017   Procedure: ESOPHAGOGASTRODUODENOSCOPY (EGD) WITH PROPOFOL;  Surgeon: Pasty Spillers, MD;  Location: [facility];  Service: Endoscopy;  Laterality: N/A;  . TONSILLECTOMY      Family History  Problem Relation Age of Onset  . Heart disease Father   . Arthritis Father   . Other Father        joint replacement  . Cancer Mother        lung  . Anemia Sister   . Other Sister        CHF  . Breast cancer Sister 69  . Cancer Sister  1 sister died of metastatic cancer  . Congestive Heart Failure Sister   . Cancer Brother        lung cancer  . Coronary artery disease Neg Hx   . Diabetes Neg Hx     Social History   Socioeconomic History  . Marital status: Married    Spouse name: Not on file  . Number of children: Not on file  . Years of education: Not on file  . Highest education level: Not on file  Occupational History  . 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 Smoking status: Current Every Day Smoker    Packs/day: 1.00    Types: Cigarettes    Last attempt to quit: 04/27/2017    Years since quitting: 2.2  . Smokeless tobacco: Never Used  . Tobacco comment: since age 70  Substance and Sexual Activity  . Alcohol use: No    Alcohol/week: 0.0 standard drinks  . Drug use: No  . Sexual activity: Not on file    Other Topics Concern  . Not on file  Social History Narrative   2 step children      Has living will   Health care POA is husband---sister [name] would be alternate   Would accept resuscitation   No prolonged tube feeds if cognitively unaware   Social Determinants of Health   Financial Resource Strain:   . Difficulty of Paying Living Expenses:   Food Insecurity:   . Worried About Programme researcher, broadcasting/film/video in the Last Year:   . Barista in the Last Year:   Transportation Needs:   . Freight forwarder (Medical):   Marland Kitchen Lack of Transportation (Non-Medical):   Physical Activity:   . Days of Exercise per Week:   . Minutes of Exercise per Session:   Stress:   . Feeling of Stress :   Social Connections:   . Frequency of Communication with Friends and Family:   . Frequency of Social Gatherings with Friends and Family:   . Attends Religious Services:   . Active Member of Clubs or Organizations:   . Attends Banker Meetings:   Marland Kitchen Marital Status:   Intimate Partner Violence:   . Fear of Current or Ex-Partner:   . Emotionally Abused:   Marland Kitchen Physically Abused:   . Sexually Abused:    Review of Systems Has gained 7# Sleeps okay at night--- 8-10 hours (usually would be 7) Then napping after Appetite is good 1 cup caffeine coffee in the am only    Objective:   Physical Exam  Constitutional: She is oriented to person, place, and time. She appears well-developed. No distress.  Neck: No thyromegaly present.  Cardiovascular: Normal rate, regular rhythm, normal heart sounds and intact distal pulses. Exam reveals no gallop.  No murmur heard. Respiratory: Effort normal. No respiratory distress. She has no wheezes. She has no rales.  Decreased breath sounds but clear  GI: Soft. There is no abdominal tenderness.  Musculoskeletal:        General: No edema.     Comments: No synovitis  Lymphadenopathy:       Head (right side): No preauricular and no posterior auricular  adenopathy present.       Head (left side): No preauricular and no posterior auricular adenopathy present.    She has no cervical adenopathy.    She has no axillary adenopathy.       Right: No  inguinal adenopathy present.       Left: No inguinal adenopathy present.  Neurological: She is alert and oriented to person, place, and time.  Skin: No rash noted.  Psychiatric: She has a normal mood and affect.           Assessment & Plan:

## 2019-07-11 NOTE — Telephone Encounter (Signed)
This is a duplicate request and it will not allow me to refuse the refill. Says I do not have clearance.

## 2019-07-11 NOTE — Assessment & Plan Note (Signed)
PDMP reviewed No concerns 

## 2019-07-12 ENCOUNTER — Other Ambulatory Visit: Payer: Self-pay | Admitting: Internal Medicine

## 2019-07-12 DIAGNOSIS — E039 Hypothyroidism, unspecified: Secondary | ICD-10-CM

## 2019-07-12 MED ORDER — LEVOTHYROXINE SODIUM 50 MCG PO TABS: 50.0000 ug | ORAL_TABLET | Freq: Every day | ORAL | 3 refills | Status: DC

## 2019-07-20 ENCOUNTER — Ambulatory Visit: Payer: Medicare HMO | Admitting: Internal Medicine

## 2019-08-09 ENCOUNTER — Other Ambulatory Visit: Payer: Self-pay

## 2019-08-09 MED ORDER — HYDROCODONE-ACETAMINOPHEN 10-325 MG PO TABS: 1.0000 | ORAL_TABLET | Freq: Three times a day (TID) | ORAL | 0 refills | Status: DC | PRN

## 2019-08-09 NOTE — Telephone Encounter (Signed)
Name of Medication: Hydrocodone Name of [facility]acy: [facility] or Written Date and Quantity: 07-11-19 #75 Last Office Visit and Type: 07-11-19 Next Office Visit and Type: 10-11-19 Last Controlled Substance Agreement Date: 11-27-18 Last UDS: 11-27-18

## 2019-08-23 ENCOUNTER — Encounter: Payer: Self-pay | Admitting: Internal Medicine

## 2019-08-23 ENCOUNTER — Ambulatory Visit (INDEPENDENT_AMBULATORY_CARE_PROVIDER_SITE_OTHER): Payer: Medicare HMO | Admitting: Internal Medicine

## 2019-08-23 ENCOUNTER — Other Ambulatory Visit: Payer: Self-pay

## 2019-08-23 DIAGNOSIS — E039 Hypothyroidism, unspecified: Secondary | ICD-10-CM | POA: Diagnosis not present

## 2019-08-23 NOTE — Progress Notes (Signed)
Subjective:    Patient ID: Catherine Griffin, female    DOB: [birth year], 64 y.o.   MRN: [id number]  HPI Here due to ongoing fatigue This visit occurred during the SARS-CoV-2 public health emergency.  Safety protocols were in place, including screening questions prior to the visit, additional usage of staff PPE, and extensive cleaning of exam room while observing appropriate contact time as indicated for disinfecting solutions.   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 aspirin 81 MG tablet Take 81 mg by mouth daily.    . cholecalciferol (VITAMIN D) 1000 UNITS tablet Take 1,000 Units by mouth daily.    . cyclobenzaprine (FLEXERIL) 10 MG tablet TAKE 1/2 TO 1 TABLET BY MOUTH AT BEDTIME AS NEEDED 30 tablet 1  . HYDROcodone-acetaminophen (NORCO) 10-325 MG tablet Take 1 tablet by mouth 3 (three) times daily as needed. 75 tablet 0  . levothyroxine (SYNTHROID) 50 MCG tablet Take 1 tablet (50 mcg total) by mouth daily before breakfast. 90 tablet 3  . Melatonin 5 MG TABS Take 1 tablet by mouth at bedtime.    . Multiple Vitamin (MULTIVITAMIN) capsule Take 1 capsule by mouth daily.     No current facility-administered medications on file prior to visit.    Allergies  Allergen Reactions  . Codeine Phosphate [Codeine] Nausea Only  . Indomethacin     REACTION: Rash/hives/swelling    Past Medical History:  Diagnosis Date  . Abdominal pain   . Abdominal pain, right upper quadrant   . Actinic keratosis    04/25/07  . Aphthous ulcer of mouth   . Arthritis   . Back pain    04/25/07  . Chest pain    04/25/07  . Diverticulosis of colon    02/28/09  . Fatigue    05/30/08  .  Hemorrhoid    12/01/10  . Hepatitis C   . Hyperlipidemia   . Hypothyroidism   . Lichen sclerosus   . Osteopenia   . Osteoporosis    04/25/07  . RUQ abdominal pain    02/07/09  . Spinal stenosis   . Thyroid disease    hypothyrodism  . Vaginitis    10/01/10  . Wears dentures    partial upper and lower    Past Surgical History:  Procedure Laterality Date  . ABDOMINAL HYSTERECTOMY    . COLONOSCOPY WITH PROPOFOL N/A 06/21/2017   Procedure: COLONOSCOPY WITH PROPOFOL;  Surgeon: Virgel Manifold, MD;  Location: [place];  Service: Endoscopy;  Laterality: N/A;  . ESOPHAGOGASTRODUODENOSCOPY (EGD) WITH PROPOFOL N/A 06/21/2017   Procedure: ESOPHAGOGASTRODUODENOSCOPY (EGD) WITH PROPOFOL;  Surgeon: Virgel Manifold, MD;  Location: [place];  Service: Endoscopy;  Laterality: N/A;  . TONSILLECTOMY      Family History  Problem Relation Age of Onset  . Heart disease Father   . Arthritis Father   . Other Father        joint replacement  . Cancer Mother        lung  . Anemia Sister   . Other Sister        CHF  . Breast cancer Sister 29  .  Cancer Sister        1 sister died of metastatic cancer  . Congestive Heart Failure Sister   . Cancer Brother        lung cancer  . Coronary artery disease Neg Hx   . Diabetes Neg Hx     Social History   Socioeconomic History  . Marital status: Married    Spouse name: Not on file  . Number of children: Not on file  . Years of education: Not on file  . Highest education level: Not on file  Occupational History  . 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 Smoking status: Current Every Day Smoker    Packs/day: 1.00    Types: Cigarettes    Last attempt to quit: 04/27/2017    Years since quitting: 2.3  . Smokeless tobacco: Never Used  . Tobacco comment: since age 89  Substance and Sexual Activity  . Alcohol use: No    Alcohol/week: 0.0 standard drinks  . Drug use: No  .  Sexual activity: Not on file  Other Topics Concern  . Not on file  Social History Narrative   2 step children      Has living will   Health care POA is husband---sister [name] would be alternate   Would accept resuscitation   No prolonged tube feeds if cognitively unaware   Social Determinants of Health   Financial Resource Strain:   . Difficulty of Paying Living Expenses:   Food Insecurity:   . Worried About Programme researcher, broadcasting/film/video in the Last Year:   . Barista in the Last Year:   Transportation Needs:   . Freight forwarder (Medical):   Marland Kitchen Lack of Transportation (Non-Medical):   Physical Activity:   . Days of Exercise per Week:   . Minutes of Exercise per Session:   Stress:   . Feeling of Stress :   Social Connections:   . Frequency of Communication with Friends and Family:   . Frequency of Social Gatherings with Friends and Family:   . Attends Religious Services:   . Active Member of Clubs or Organizations:   . Attends Banker Meetings:   Marland Kitchen Marital Status:   Intimate Partner Violence:   . Fear of Current or Ex-Partner:   . Emotionally Abused:   Marland Kitchen Physically Abused:   . Sexually Abused:    Review of Systems  Appetite is fine Weight is up some Sleeps well---not wanting to sleep all the time anymore Bowels are slow at times from the hydrocodone-uses dulcolax rarely    Objective:   Physical Exam  Constitutional: She appears well-developed. No distress.  Neck: No thyromegaly present.  Cardiovascular: Normal rate, regular rhythm and normal heart sounds. Exam reveals no gallop.  No murmur heard. Respiratory: Effort normal and breath sounds normal. No respiratory distress. She has no wheezes. She has no rales.  GI: Soft. There is no abdominal tenderness.  Musculoskeletal:        General: No edema.  Lymphadenopathy:    She has no cervical adenopathy.  Psychiatric: She has a normal mood and affect. Her behavior is normal.            Assessment & Plan:

## 2019-08-23 NOTE — Addendum Note (Signed)
Addended by: Damita Lack on: 08/23/2019 03:56 PM   Modules accepted: Orders

## 2019-08-23 NOTE — Assessment & Plan Note (Addendum)
Still fatigued despite starting the supplement Some better this week Will recheck the labs and titrate prn (increase levothyroxine to 75 if still low)

## 2019-08-24 LAB — TSH: TSH: 0.75 u[IU]/mL (ref 0.35–4.50)

## 2019-08-24 LAB — T4, FREE: Free T4: 0.68 ng/dL (ref 0.60–1.60)

## 2019-08-26 ENCOUNTER — Other Ambulatory Visit: Payer: Self-pay | Admitting: Internal Medicine

## 2019-09-06 ENCOUNTER — Other Ambulatory Visit: Payer: Self-pay

## 2019-09-06 ENCOUNTER — Ambulatory Visit: Payer: Medicare HMO | Admitting: Internal Medicine

## 2019-09-06 MED ORDER — HYDROCODONE-ACETAMINOPHEN 10-325 MG PO TABS: 1.0000 | ORAL_TABLET | Freq: Three times a day (TID) | ORAL | 0 refills | Status: DC | PRN

## 2019-09-06 NOTE — Telephone Encounter (Signed)
Name of Medication: Hydrocodone Name of [facility]acy: [facility] or Written Date and Quantity: 08-09-19 #75 Last Office Visit and Type: 08-23-19 Next Office Visit and Type: 10-11-19 Last Controlled Substance Agreement Date: 11-27-18 Last UDS: 11-27-18

## 2019-09-06 NOTE — Telephone Encounter (Signed)
Will route to PCP 

## 2019-10-03 ENCOUNTER — Ambulatory Visit: Payer: Medicare HMO | Admitting: Podiatry

## 2019-10-03 ENCOUNTER — Other Ambulatory Visit: Payer: Self-pay

## 2019-10-03 ENCOUNTER — Encounter: Payer: Self-pay | Admitting: Podiatry

## 2019-10-03 ENCOUNTER — Ambulatory Visit (INDEPENDENT_AMBULATORY_CARE_PROVIDER_SITE_OTHER): Payer: Medicare HMO

## 2019-10-03 DIAGNOSIS — S92335A Nondisplaced fracture of third metatarsal bone, left foot, initial encounter for closed fracture: Secondary | ICD-10-CM

## 2019-10-03 DIAGNOSIS — S9032XA Contusion of left foot, initial encounter: Secondary | ICD-10-CM

## 2019-10-03 NOTE — Progress Notes (Signed)
Subjective:  Patient ID: Catherine Griffin, female    DOB: February 15, 1956,  MRN: [id number] HPI Chief Complaint  Patient presents with  . Foot Injury    Dorsal midfoot left - dropped a lounge chair on foot April 2021, been swollen and sore since, tried ice, compression, rest and looser shoes-no help  . New Patient (Initial Visit)    64 y.o. female presents with the above complaint.   ROS: Denies fever chills nausea vomiting muscle aches pains calf pain back pain chest pain shortness of breath.  Past Medical History:  Diagnosis Date  . Abdominal pain   . Abdominal pain, right upper quadrant   . Actinic keratosis    04/25/07  . Aphthous ulcer of mouth   . Arthritis   . Back pain    04/25/07  . Chest pain    04/25/07  . Diverticulosis of colon    02/28/09  . Fatigue    05/30/08  . Hemorrhoid    12/01/10  . Hepatitis C   . Hyperlipidemia   . Hypothyroidism   . Hypothyroidism   . Lichen sclerosus   . Osteopenia   . Osteoporosis    04/25/07  . RUQ abdominal pain    02/07/09  . Spinal stenosis   . Thyroid disease    hypothyrodism  . Vaginitis    10/01/10  . Wears dentures    partial upper and lower   Past Surgical History:  Procedure Laterality Date  . ABDOMINAL HYSTERECTOMY    . COLONOSCOPY WITH PROPOFOL N/A 06/21/2017   Procedure: COLONOSCOPY WITH PROPOFOL;  Surgeon: Virgel Manifold, MD;  Location: [place];  Service: Endoscopy;  Laterality: N/A;  . ESOPHAGOGASTRODUODENOSCOPY (EGD) WITH PROPOFOL N/A 06/21/2017   Procedure: ESOPHAGOGASTRODUODENOSCOPY (EGD) WITH PROPOFOL;  Surgeon: Virgel Manifold, MD;  Location: [place];  Service: Endoscopy;  Laterality: N/A;  . TONSILLECTOMY      Current Outpatient Medications:  .  aspirin 81 MG tablet, Take 81 mg by mouth daily., Disp: , Rfl:  .  cholecalciferol (VITAMIN D) 1000 UNITS tablet, Take 1,000 Units by mouth daily., Disp: , Rfl:  .  cyclobenzaprine (FLEXERIL) 10 MG tablet, TAKE 1/2 TO 1  TABLET BY MOUTH AT BEDTIME AS NEEDED, Disp: 30 tablet, Rfl: 1 .  HYDROcodone-acetaminophen (NORCO) 10-325 MG tablet, Take 1 tablet by mouth 3 (three) times daily as needed., Disp: 75 tablet, Rfl: 0 .  levothyroxine (SYNTHROID) 50 MCG tablet, Take 1 tablet (50 mcg total) by mouth daily before breakfast., Disp: 90 tablet, Rfl: 3 .  Melatonin 5 MG TABS, Take 1 tablet by mouth at bedtime., Disp: , Rfl:  .  Multiple Vitamin (MULTIVITAMIN) capsule, Take 1 capsule by mouth daily., Disp: , Rfl:   Allergies  Allergen Reactions  . Codeine Phosphate [Codeine] Nausea Only  . Indomethacin     REACTION: Rash/hives/swelling   Review of Systems Objective:  There were no vitals filed for this visit.  General: Well developed, nourished, in no acute distress, alert and oriented x3   Dermatological: Skin is warm, dry and supple bilateral. Nails x 10 are well maintained; remaining integument appears unremarkable at this time. There are no open sores, no preulcerative lesions, no rash or signs of infection present.  Vascular: Dorsalis Pedis artery and Posterior Tibial artery pedal pulses are 2/4 bilateral with immedate capillary fill time. Pedal hair growth present. No varicosities and no lower extremity edema present bilateral.   Neruologic: Grossly intact via light touch bilateral. Vibratory intact via  tuning fork bilateral. Protective threshold with Semmes Wienstein monofilament intact to all pedal sites bilateral. Patellar and Achilles deep tendon reflexes 2+ bilateral. No Babinski or clonus noted bilateral.   Musculoskeletal: No gross boney pedal deformities bilateral. No pain, crepitus, or limitation noted with foot and ankle range of motion bilateral. Muscular strength 5/5 in all groups tested bilateral.  She has pain on range of motion of the third TMT joint and on direct palpation of the base of the third metatarsal.  She also has pain on medial lateral compression of the TMT joints.  Gait: Unassisted,  Nonantalgic.    Radiographs:  Radiographs demonstrate what appears to be a nondisplaced transverse fracture of the base of the third metatarsal.  Mild osteopenia is noted.  Assessment & Plan:   Assessment: Probable stress fracture overlying the third metatarsal base.   Plan: Recommended a short cam boot for about 1 month I like to follow-up with her at that time.  Another set of x-rays will be necessary.     Daleyza Gadomski T. Fowler, [place]

## 2019-10-04 ENCOUNTER — Other Ambulatory Visit: Payer: Self-pay

## 2019-10-05 MED ORDER — HYDROCODONE-ACETAMINOPHEN 10-325 MG PO TABS: 1.0000 | ORAL_TABLET | Freq: Three times a day (TID) | ORAL | 0 refills | Status: DC | PRN

## 2019-10-05 NOTE — Telephone Encounter (Signed)
Name of Medication:Hydrocodone Name of Pharmacy:[facility] or Written Date and Quantity:09-06-19 #75 Last Office Visit and Type:08-23-19 Next Office Visit and Type:10-11-19 Last Controlled Substance Agreement Date:11-27-18 Last UDS:11-27-18

## 2019-10-11 ENCOUNTER — Other Ambulatory Visit: Payer: Self-pay

## 2019-10-11 ENCOUNTER — Encounter: Payer: Self-pay | Admitting: Internal Medicine

## 2019-10-11 ENCOUNTER — Ambulatory Visit (INDEPENDENT_AMBULATORY_CARE_PROVIDER_SITE_OTHER): Payer: Medicare HMO | Admitting: Internal Medicine

## 2019-10-11 DIAGNOSIS — G8929 Other chronic pain: Secondary | ICD-10-CM

## 2019-10-11 DIAGNOSIS — F1120 Opioid dependence, uncomplicated: Secondary | ICD-10-CM | POA: Diagnosis not present

## 2019-10-11 DIAGNOSIS — M549 Dorsalgia, unspecified: Secondary | ICD-10-CM

## 2019-10-11 NOTE — Assessment & Plan Note (Addendum)
Exacerbated by work demands, etc Doing okay with the hydrocodone and ibuprofen (for her foot)

## 2019-10-11 NOTE — Assessment & Plan Note (Signed)
PDMP reviewed No concerns 

## 2019-10-11 NOTE — Progress Notes (Signed)
Subjective:    Patient ID: Catherine Griffin, female    DOB: 1956-03-01, 64 y.o.   MRN: [id number]  HPI Here for follow up of chronic pain and narcotic dependence This visit occurred during the SARS-CoV-2 public health emergency.  Safety protocols were in place, including screening questions prior to the visit, additional usage of staff PPE, and extensive cleaning of exam room while observing appropriate contact time as indicated for disinfecting solutions.   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 aspirin 81 MG tablet Take 81 mg by mouth daily.    . cholecalciferol (VITAMIN D) 1000 UNITS tablet Take 1,000 Units by mouth daily.    . cyclobenzaprine (FLEXERIL) 10 MG tablet TAKE 1/2 TO 1 TABLET BY MOUTH AT BEDTIME AS NEEDED 30 tablet 1  . HYDROcodone-acetaminophen (NORCO) 10-325 MG tablet Take 1 tablet by mouth 3 (three) times daily as needed. 75 tablet 0  . levothyroxine (SYNTHROID) 50 MCG tablet Take 1 tablet (50 mcg total) by mouth daily before breakfast. 90 tablet 3  . Multiple Vitamin (MULTIVITAMIN) capsule Take 1 capsule by mouth daily.     No current facility-administered medications on file prior to visit.    Allergies  Allergen Reactions  . Codeine Phosphate [Codeine] Nausea Only  . Indomethacin     REACTION: Rash/hives/swelling    Past Medical History:  Diagnosis Date  . Abdominal pain   . Abdominal pain, right upper quadrant   . Actinic keratosis    04/25/07  . Aphthous ulcer of mouth   . Arthritis   . Back pain    04/25/07  .  Chest pain    04/25/07  . Diverticulosis of colon    02/28/09  . Fatigue    05/30/08  . Hemorrhoid    12/01/10  . Hepatitis C   . Hyperlipidemia   . Hypothyroidism   . Hypothyroidism   . Lichen sclerosus   . Osteopenia   . Osteoporosis    04/25/07  . RUQ abdominal pain    02/07/09  . Spinal stenosis   . Thyroid disease    hypothyrodism  . Vaginitis    10/01/10  . Wears dentures    partial upper and lower    Past Surgical History:  Procedure Laterality Date  . ABDOMINAL HYSTERECTOMY    . COLONOSCOPY WITH PROPOFOL N/A 06/21/2017   Procedure: COLONOSCOPY WITH PROPOFOL;  Surgeon: Pasty Spillers, MD;  Location: [facility];  Service: Endoscopy;  Laterality: N/A;  . ESOPHAGOGASTRODUODENOSCOPY (EGD) WITH PROPOFOL N/A 06/21/2017   Procedure: ESOPHAGOGASTRODUODENOSCOPY (EGD) WITH PROPOFOL;  Surgeon: Pasty Spillers, MD;  Location: [facility];  Service: Endoscopy;  Laterality: N/A;  . TONSILLECTOMY      Family History  Problem Relation Age of Onset  . Heart disease Father   . Arthritis Father   . Other Father        joint replacement  . Cancer Mother        lung  . Anemia Sister   . Other Sister  CHF  . Breast cancer Sister 27  . Cancer Sister        1 sister died of metastatic cancer  . Congestive Heart Failure Sister   . Cancer Brother        lung cancer  . Coronary artery disease Neg Hx   . Diabetes Neg Hx     Social History   Socioeconomic History  . Marital status: Married    Spouse name: Not on file  . Number of children: Not on file  . Years of education: Not on file  . Highest education level: Not on file  Occupational History  . 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 Smoking status: Current Every Day Smoker    Packs/day: 1.00    Types: Cigarettes    Last attempt to quit: 04/27/2017    Years since quitting: 2.4  . Smokeless tobacco: Never Used  . Tobacco comment: since age 22    Vaping Use  . Vaping Use: Never used  Substance and Sexual Activity  . Alcohol use: No    Alcohol/week: 0.0 standard drinks  . Drug use: No  . Sexual activity: Not on file  Other Topics Concern  . Not on file  Social History Narrative   2 step children      Has living will   Health care POA is husband---sister [name] would be alternate   Would accept resuscitation   No prolonged tube feeds if cognitively unaware   Social Determinants of Health   Financial Resource Strain:   . Difficulty of Paying Living Expenses:   Food Insecurity:   . Worried About Programme researcher, broadcasting/film/video in the Last Year:   . Barista in the Last Year:   Transportation Needs:   . Freight forwarder (Medical):   Marland Kitchen Lack of Transportation (Non-Medical):   Physical Activity:   . Days of Exercise per Week:   . Minutes of Exercise per Session:   Stress:   . Feeling of Stress :   Social Connections:   . Frequency of Communication with Friends and Family:   . Frequency of Social Gatherings with Friends and Family:   . Attends Religious Services:   . Active Member of Clubs or Organizations:   . Attends Banker Meetings:   Marland Kitchen Marital Status:   Intimate Partner Violence:   . Fear of Current or Ex-Partner:   . Emotionally Abused:   Marland Kitchen Physically Abused:   . Sexually Abused:    Review of Systems  Sleeping okay Stopped the melatonin     Objective:   Physical Exam Constitutional:      General: She is not in acute distress.    Appearance: Normal appearance.  Neurological:     Mental Status: She is alert.  Psychiatric:        Mood and Affect: Mood normal.        Behavior: Behavior normal.            Assessment & Plan:

## 2019-10-25 ENCOUNTER — Other Ambulatory Visit: Payer: Self-pay | Admitting: Internal Medicine

## 2019-10-26 NOTE — Telephone Encounter (Signed)
Last filled 09-25-19 #30 Last OV 10-11-19 Next OV 02-18-20 [facility] [place] [place]

## 2019-11-02 MED ORDER — HYDROCODONE-ACETAMINOPHEN 10-325 MG PO TABS: 1.0000 | ORAL_TABLET | Freq: Three times a day (TID) | ORAL | 0 refills | Status: DC | PRN

## 2019-11-02 NOTE — Telephone Encounter (Signed)
Name of Medication:Hydrocodone Name of Pharmacy:[facility] or Written Date and Quantity:10-05-19 #75 Last Office Visit and Type:10-11-19 Next Office Visit and Type:02-28-20 Last Controlled Substance Agreement Date:11-27-18 Last UDS:11-27-18  Forwarding to [facility] in Dr Karle Starch absence

## 2019-11-05 MED ORDER — FLUCONAZOLE 150 MG PO TABS: 150.0000 mg | ORAL_TABLET | Freq: Once | ORAL | 1 refills | Status: AC

## 2019-11-14 ENCOUNTER — Ambulatory Visit: Payer: Medicare HMO | Admitting: Podiatry

## 2019-11-30 ENCOUNTER — Other Ambulatory Visit: Payer: Self-pay | Admitting: Internal Medicine

## 2019-11-30 MED ORDER — HYDROCODONE-ACETAMINOPHEN 10-325 MG PO TABS: 1.0000 | ORAL_TABLET | Freq: Three times a day (TID) | ORAL | 0 refills | Status: DC | PRN

## 2019-11-30 NOTE — Telephone Encounter (Signed)
Name of Medication:Hydrocodone Name of Pharmacy:[facility] Written Date and Quantity:11-02-19 #75 Last Office Visit and Type:10-11-19 Next Office Visit and Type:02-28-20 Last Controlled Substance Agreement Date:11-27-18 Last UDS:11-27-18

## 2019-12-19 ENCOUNTER — Ambulatory Visit: Payer: Medicare HMO | Admitting: Podiatry

## 2019-12-24 ENCOUNTER — Other Ambulatory Visit: Payer: Self-pay | Admitting: Internal Medicine

## 2019-12-25 NOTE — Telephone Encounter (Signed)
Last filled 11-25-19 #30 Last Office Visit 10-11-19 Next Office Visit 02-28-20 [facility]

## 2019-12-27 ENCOUNTER — Other Ambulatory Visit: Payer: Self-pay

## 2019-12-27 MED ORDER — HYDROCODONE-ACETAMINOPHEN 10-325 MG PO TABS: 1.0000 | ORAL_TABLET | Freq: Three times a day (TID) | ORAL | 0 refills | Status: DC | PRN

## 2019-12-27 NOTE — Telephone Encounter (Signed)
Name of Medication:Hydrocodone Name of Pharmacy:[facility] Written Date and Quantity:11-30-19#75 Last Office Visit and Type:10-11-19 Next Office Visit and Type:02-28-20 Last Controlled Substance Agreement Date:11-27-18 Last UDS:11-27-18

## 2020-01-24 ENCOUNTER — Other Ambulatory Visit: Payer: Self-pay

## 2020-01-24 MED ORDER — HYDROCODONE-ACETAMINOPHEN 10-325 MG PO TABS: 1.0000 | ORAL_TABLET | Freq: Three times a day (TID) | ORAL | 0 refills | Status: DC | PRN

## 2020-01-31 ENCOUNTER — Other Ambulatory Visit: Payer: Self-pay | Admitting: Internal Medicine

## 2020-01-31 DIAGNOSIS — Z1231 Encounter for screening mammogram for malignant neoplasm of breast: Secondary | ICD-10-CM

## 2020-02-12 ENCOUNTER — Ambulatory Visit: Payer: Medicare HMO | Admitting: Internal Medicine

## 2020-02-18 ENCOUNTER — Other Ambulatory Visit: Payer: Self-pay | Admitting: Internal Medicine

## 2020-02-21 ENCOUNTER — Other Ambulatory Visit: Payer: Self-pay

## 2020-02-21 MED ORDER — HYDROCODONE-ACETAMINOPHEN 10-325 MG PO TABS: 1.0000 | ORAL_TABLET | Freq: Three times a day (TID) | ORAL | 0 refills | Status: DC | PRN

## 2020-02-21 NOTE — Telephone Encounter (Signed)
Pharmacy requests refill on: Hydrocodone-Acetaminophen 10-325 mg   LAST REFILL: 01/24/2020 LAST OV: 10/11/2019 NEXT OV: 02/28/2020 PHARMACY: [id number] [place], [place]

## 2020-02-28 ENCOUNTER — Other Ambulatory Visit: Payer: Self-pay

## 2020-02-28 ENCOUNTER — Encounter: Payer: Self-pay | Admitting: Internal Medicine

## 2020-02-28 ENCOUNTER — Ambulatory Visit (INDEPENDENT_AMBULATORY_CARE_PROVIDER_SITE_OTHER): Payer: Medicare HMO | Admitting: Internal Medicine

## 2020-02-28 VITALS — BP 108/76 | HR 90 | Temp 97.5°F | Ht 62.0 in | Wt 140.0 lb

## 2020-02-28 DIAGNOSIS — G8929 Other chronic pain: Secondary | ICD-10-CM

## 2020-02-28 DIAGNOSIS — F1120 Opioid dependence, uncomplicated: Secondary | ICD-10-CM

## 2020-02-28 DIAGNOSIS — M549 Dorsalgia, unspecified: Secondary | ICD-10-CM

## 2020-02-28 DIAGNOSIS — Z23 Encounter for immunization: Secondary | ICD-10-CM | POA: Diagnosis not present

## 2020-02-28 NOTE — Assessment & Plan Note (Signed)
PDMP reviewed No concerns 

## 2020-02-28 NOTE — Assessment & Plan Note (Signed)
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

## 2020-02-28 NOTE — Addendum Note (Signed)
Addended by: Eual Fines on: 02/28/2020 03:48 PM   Modules accepted: Orders

## 2020-02-28 NOTE — Progress Notes (Signed)
Subjective:    Patient ID: Catherine Griffin, female    DOB: [birth year], 64 y.o.   MRN: [id number]  HPI Here for follow up of chronic back pain and narcotic dependence This visit occurred during the SARS-CoV-2 public health emergency.  Safety protocols were in place, including screening questions prior to the visit, additional usage of staff PPE, and extensive cleaning of exam room while observing appropriate contact time as indicated for disinfecting solutions.   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     cholecalciferol (VITAMIN D) 1000 UNITS tablet Take 1,000 Units by mouth daily.     cyclobenzaprine (FLEXERIL) 10 MG tablet TAKE 1/2 TO 1 TABLET BY MOUTH AT BEDTIME AS NEEDED 30 tablet 1   HYDROcodone-acetaminophen (NORCO) 10-325 MG tablet Take 1 tablet by mouth 3 (three) times daily as needed. 75 tablet 0   levothyroxine (SYNTHROID) 50 MCG tablet Take 1 tablet (50 mcg total) by mouth daily before breakfast. 90 tablet 3   Multiple Vitamin (MULTIVITAMIN) capsule Take 1 capsule by mouth daily.     No current facility-administered medications on file prior to visit.    Allergies  Allergen Reactions   Codeine Phosphate [Codeine] Nausea Only   Indomethacin     REACTION: Rash/hives/swelling    Past Medical History:  Diagnosis Date   Abdominal pain    Abdominal pain, right upper quadrant    Actinic keratosis    04/25/07   Aphthous ulcer of mouth    Arthritis    Back pain    04/25/07   Chest pain    04/25/07   Diverticulosis of colon    02/28/09   Fatigue    05/30/08   Hemorrhoid    12/01/10   Hepatitis C      Hyperlipidemia    Hypothyroidism    Hypothyroidism    Lichen sclerosus    Osteopenia    Osteoporosis    04/25/07   RUQ abdominal pain    02/07/09   Spinal stenosis    Thyroid disease    hypothyrodism   Vaginitis    10/01/10   Wears dentures    partial upper and lower    Past Surgical History:  Procedure Laterality Date   ABDOMINAL HYSTERECTOMY     COLONOSCOPY WITH PROPOFOL N/A 06/21/2017   Procedure: COLONOSCOPY WITH PROPOFOL;  Surgeon: Pasty Spillers, MD;  Location: [facility];  Service: Endoscopy;  Laterality: N/A;   ESOPHAGOGASTRODUODENOSCOPY (EGD) WITH PROPOFOL N/A 06/21/2017   Procedure: ESOPHAGOGASTRODUODENOSCOPY (EGD) WITH PROPOFOL;  Surgeon: Pasty Spillers, MD;  Location: [facility];  Service: Endoscopy;  Laterality: N/A;   TONSILLECTOMY      Family History  Problem Relation Age of Onset   Heart disease Father    Arthritis Father    Other Father        joint replacement   Cancer Mother        lung   Anemia Sister    Other Sister        CHF   Breast cancer Sister 41   Cancer Sister        1  sister died of metastatic cancer   Congestive Heart Failure Sister    Cancer Brother        lung cancer   Coronary artery disease Neg Hx    Diabetes Neg Hx     Social History   Socioeconomic History   Marital status: Married    Spouse name: Not on file   Number of children: Not on file   Years of education: Not on file   Highest education level: Not on file  Occupational History   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  Tobacco Use   Smoking status: Current Every Day Smoker    Packs/day: 1.00    Types: Cigarettes    Last attempt to quit: 04/27/2017    Years since quitting: 2.8   Smokeless tobacco: Never Used   Tobacco comment: since age 25  Vaping Use   Vaping Use: Never used  Substance and Sexual Activity   Alcohol use: No    Alcohol/week: 0.0 standard drinks    Drug use: No   Sexual activity: Not on file  Other Topics Concern   Not on file  Social History Narrative   2 step children      Has living will   Health care POA is husband---sister [name] would be alternate   Would accept resuscitation   No prolonged tube feeds if cognitively unaware   Social Determinants of Health   Financial Resource Strain:    Difficulty of Paying Living Expenses: Not on file  Food Insecurity:    Worried About Running Out of Food in the Last Year: Not on file   Ran Out of Food in the Last Year: Not on file  Transportation Needs:    Lack of Transportation (Medical): Not on file   Lack of Transportation (Non-Medical): Not on file  Physical Activity:    Days of Exercise per Week: Not on file   Minutes of Exercise per Session: Not on file  Stress:    Feeling of Stress : Not on file  Social Connections:    Frequency of Communication with Friends and Family: Not on file   Frequency of Social Gatherings with Friends and Family: Not on file   Attends Religious Services: Not on file   Active Member of Clubs or Organizations: Not on file   Attends Banker Meetings: Not on file   Marital Status: Not on file  Intimate Partner Violence:    Fear of Current or Ex-Partner: Not on file   Emotionally Abused: Not on file   Physically Abused: Not on file   Sexually Abused: Not on file   Review of Systems Has lost some weight---relates it to the thyroid Rx Feels better in that way    Objective:   Physical Exam         Assessment & Plan:

## 2020-03-07 LAB — DRUG MONITORING, PANEL 8 WITH CONFIRMATION, URINE
6 Acetylmorphine: NEGATIVE ng/mL (ref <10)
Alcohol Metabolites: NEGATIVE ng/mL
Amphetamines: NEGATIVE ng/mL (ref <500)
Benzodiazepines: NEGATIVE ng/mL (ref <100)
Buprenorphine, Urine: NEGATIVE ng/mL (ref <5)
Cocaine Metabolite: NEGATIVE ng/mL (ref <150)
Codeine: NEGATIVE ng/mL (ref <50)
Creatinine: 104.6 mg/dL
Hydrocodone: 1253 ng/mL — ABNORMAL HIGH (ref <50)
Hydromorphone: 1044 ng/mL — ABNORMAL HIGH (ref <50)
MDMA: NEGATIVE ng/mL (ref <500)
Marijuana Metabolite: NEGATIVE ng/mL (ref <20)
Morphine: NEGATIVE ng/mL (ref <50)
Norhydrocodone: 2983 ng/mL — ABNORMAL HIGH (ref <50)
Opiates: POSITIVE ng/mL — AB (ref <100)
Oxidant: NEGATIVE ug/mL
pH: 5.6 (ref 4.5–9.0)

## 2020-03-07 LAB — DM TEMPLATE

## 2020-03-10 ENCOUNTER — Other Ambulatory Visit: Payer: Self-pay

## 2020-03-10 ENCOUNTER — Ambulatory Visit
Admission: RE | Admit: 2020-03-10 | Discharge: 2020-03-10 | Disposition: A | Discharge status: Home / Self Care | Payer: Medicare HMO | Source: Ambulatory Visit | Attending: Internal Medicine | Admitting: Internal Medicine

## 2020-03-10 DIAGNOSIS — Z1231 Encounter for screening mammogram for malignant neoplasm of breast: Secondary | ICD-10-CM

## 2020-03-20 ENCOUNTER — Other Ambulatory Visit: Payer: Self-pay

## 2020-03-20 MED ORDER — HYDROCODONE-ACETAMINOPHEN 10-325 MG PO TABS: 1.0000 | ORAL_TABLET | Freq: Three times a day (TID) | ORAL | 0 refills | Status: DC | PRN

## 2020-03-20 NOTE — Telephone Encounter (Signed)
Name of Medication:Hydrocodone Name of [facility] Date and Quantity:11-30-19#75 Last Office Visit and Type:02-28-20 Next Office Visit and Type:Not Schduled Last Controlled Substance Agreement Date:02-28-20 Last UDS:02-28-20

## 2020-03-20 NOTE — Telephone Encounter (Signed)
She needs her routine 3 month follow up in February. Thanks!

## 2020-04-17 ENCOUNTER — Other Ambulatory Visit: Payer: Self-pay

## 2020-04-17 ENCOUNTER — Other Ambulatory Visit: Payer: Self-pay | Admitting: Internal Medicine

## 2020-04-17 MED ORDER — HYDROCODONE-ACETAMINOPHEN 10-325 MG PO TABS: 1.0000 | ORAL_TABLET | Freq: Three times a day (TID) | ORAL | 0 refills | Status: DC | PRN

## 2020-05-16 ENCOUNTER — Other Ambulatory Visit: Payer: Self-pay

## 2020-05-16 NOTE — Telephone Encounter (Signed)
Name of Medication:Hydrocodone Name of Pharmacy:[facility] or Written Date and Quantity:04-17-20#75 Last Office Visit and Type:02-28-20 Next Office Visit and Type:06-05-20 Last Controlled Substance Agreement Date:02-28-20 Last UDS:02-28-20

## 2020-05-18 MED ORDER — HYDROCODONE-ACETAMINOPHEN 10-325 MG PO TABS: 1.0000 | ORAL_TABLET | Freq: Three times a day (TID) | ORAL | 0 refills | Status: DC | PRN

## 2020-06-05 ENCOUNTER — Other Ambulatory Visit: Payer: Self-pay

## 2020-06-05 ENCOUNTER — Ambulatory Visit (INDEPENDENT_AMBULATORY_CARE_PROVIDER_SITE_OTHER): Payer: Medicare HMO | Admitting: Internal Medicine

## 2020-06-05 ENCOUNTER — Encounter: Payer: Self-pay | Admitting: Internal Medicine

## 2020-06-05 VITALS — BP 122/68 | HR 78 | Temp 96.9°F | Wt 143.1 lb

## 2020-06-05 DIAGNOSIS — J449 Chronic obstructive pulmonary disease, unspecified: Secondary | ICD-10-CM | POA: Diagnosis not present

## 2020-06-05 DIAGNOSIS — G8929 Other chronic pain: Secondary | ICD-10-CM | POA: Diagnosis not present

## 2020-06-05 DIAGNOSIS — F1120 Opioid dependence, uncomplicated: Secondary | ICD-10-CM

## 2020-06-05 DIAGNOSIS — Z23 Encounter for immunization: Secondary | ICD-10-CM | POA: Diagnosis not present

## 2020-06-05 DIAGNOSIS — M549 Dorsalgia, unspecified: Secondary | ICD-10-CM

## 2020-06-05 NOTE — Addendum Note (Signed)
Addended by: Shon Millet on: 06/05/2020 04:22 PM   Modules accepted: Orders

## 2020-06-05 NOTE — Assessment & Plan Note (Signed)
Remains functional on 2.5 hydrocodone most days No problems with this

## 2020-06-05 NOTE — Assessment & Plan Note (Addendum)
Urged her to try to at least cut back on the smoking---just not able to quit Will update pneumovax

## 2020-06-05 NOTE — Progress Notes (Signed)
Subjective:    Patient ID: Catherine Griffin, female    DOB: 17-Jul-1955, 65 y.o.   MRN: [id number]  HPI Here for follow up of chronic back pain This visit occurred during the SARS-CoV-2 public health emergency.  Safety protocols were in place, including screening questions prior to the visit, additional usage of staff PPE, and extensive cleaning of exam room while observing appropriate contact time as indicated for disinfecting solutions.   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 Usually dry Doesn't feel limited by her breathing--but does breathe harder with activity  Current Outpatient Medications on File Prior to Visit  Medication Sig Dispense Refill  . aspirin 81 MG tablet Take 81 mg by mouth daily.    . cholecalciferol (VITAMIN D) 1000 UNITS tablet Take 1,000 Units by mouth daily.    . cyclobenzaprine (FLEXERIL) 10 MG tablet TAKE 1/2 TO 1 TABLET BY MOUTH AT BEDTIME AS NEEDED 30 tablet 1  . HYDROcodone-acetaminophen (NORCO) 10-325 MG tablet Take 1 tablet by mouth 3 (three) times daily as needed. 75 tablet 0  . levothyroxine (SYNTHROID) 50 MCG tablet Take 1 tablet (50 mcg total) by mouth daily before breakfast. 90 tablet 3  . Multiple Vitamin (MULTIVITAMIN) capsule Take 1 capsule by mouth daily.     No current facility-administered medications on file prior to visit.    Allergies  Allergen Reactions  . Codeine Phosphate [Codeine] Nausea Only  . Indomethacin     REACTION: Rash/hives/swelling    Past Medical History:  Diagnosis Date  . Abdominal pain   . Abdominal pain, right upper quadrant   . Actinic keratosis    04/25/07  . Aphthous ulcer of mouth   . Arthritis   . Back pain    04/25/07  . Chest pain    04/25/07  .  Diverticulosis of colon    02/28/09  . Fatigue    05/30/08  . Hemorrhoid    12/01/10  . Hepatitis C   . Hyperlipidemia   . Hypothyroidism   . Hypothyroidism   . Lichen sclerosus   . Osteopenia   . Osteoporosis    04/25/07  . RUQ abdominal pain    02/07/09  . Spinal stenosis   . Thyroid disease    hypothyrodism  . Vaginitis    10/01/10  . Wears dentures    partial upper and lower    Past Surgical History:  Procedure Laterality Date  . ABDOMINAL HYSTERECTOMY    . COLONOSCOPY WITH PROPOFOL N/A 06/21/2017   Procedure: COLONOSCOPY WITH PROPOFOL;  Surgeon: Pasty Spillers, MD;  Location: [facility];  Service: Endoscopy;  Laterality: N/A;  . ESOPHAGOGASTRODUODENOSCOPY (EGD) WITH PROPOFOL N/A 06/21/2017   Procedure: ESOPHAGOGASTRODUODENOSCOPY (EGD) WITH PROPOFOL;  Surgeon: Pasty Spillers, MD;  Location: [facility];  Service: Endoscopy;  Laterality: N/A;  . TONSILLECTOMY      Family History  Problem Relation Age of Onset  . Heart disease Father   . Arthritis Father   . Other Father        joint replacement  . Cancer Mother        lung  . Anemia Sister   . Other Sister  CHF  . Breast cancer Sister 42  . Cancer Sister        1 sister died of metastatic cancer  . Congestive Heart Failure Sister   . Cancer Brother        lung cancer  . Coronary artery disease Neg Hx   . Diabetes Neg Hx     Social History   Socioeconomic History  . Marital status: Married    Spouse name: Not on file  . Number of children: Not on file  . Years of education: Not on file  . Highest education level: Not on file  Occupational History  . 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 Smoking status: Current Every Day Smoker    Packs/day: 1.00    Types: Cigarettes    Last attempt to quit: 04/27/2017    Years since quitting: 3.1  . Smokeless tobacco: Never Used  . Tobacco comment: since age 34  Vaping Use  . Vaping Use:  Never used  Substance and Sexual Activity  . Alcohol use: No    Alcohol/week: 0.0 standard drinks  . Drug use: No  . Sexual activity: Not on file  Other Topics Concern  . Not on file  Social History Narrative   2 step children      Has living will   Health care POA is husband---sister [name] would be alternate   Would accept resuscitation   No prolonged tube feeds if cognitively unaware   Social Determinants of Health   Financial Resource Strain: Not on file  Food Insecurity: Not on file  Transportation Needs: Not on file  Physical Activity: Not on file  Stress: Not on file  Social Connections: Not on file  Intimate Partner Violence: Not on file   Review of Systems  Sleeps fairly well Up coughing at times Appetite is fine Weight is stable     Objective:   Physical Exam Constitutional:      Appearance: Normal appearance.  Neurological:     Mental Status: She is alert.  Psychiatric:        Mood and Affect: Mood normal.        Behavior: Behavior normal.            Assessment & Plan:

## 2020-06-05 NOTE — Assessment & Plan Note (Signed)
PDMP reviewed No concerns 

## 2020-06-06 ENCOUNTER — Other Ambulatory Visit: Payer: Self-pay | Admitting: Internal Medicine

## 2020-06-12 ENCOUNTER — Other Ambulatory Visit: Payer: Self-pay | Admitting: Internal Medicine

## 2020-06-12 ENCOUNTER — Other Ambulatory Visit: Payer: Self-pay

## 2020-06-12 MED ORDER — HYDROCODONE-ACETAMINOPHEN 10-325 MG PO TABS
1.0000 | ORAL_TABLET | Freq: Three times a day (TID) | ORAL | 0 refills | Status: DC | PRN
Start: 2020-06-12 — End: 2020-07-10

## 2020-06-12 NOTE — Telephone Encounter (Signed)
Name of Medication:Hydrocodone Name of Pharmacy:[facility] or Written Date and Quantity: 05-18-20#75 Last Office Visit and Type:06-05-20 Next Office Visit and Type:09-04-20 Last Controlled Substance Agreement Date:02-28-20 Last UDS:02-28-20

## 2020-07-10 ENCOUNTER — Other Ambulatory Visit: Payer: Self-pay

## 2020-07-10 MED ORDER — HYDROCODONE-ACETAMINOPHEN 10-325 MG PO TABS: 1.0000 | ORAL_TABLET | Freq: Three times a day (TID) | ORAL | 0 refills | Status: DC | PRN

## 2020-07-10 NOTE — Telephone Encounter (Signed)
Name of Medication:Hydrocodone Name of Pharmacy:[facility] or Written Date and Quantity: 06-12-20#75 Last Office Visit and Type:06-05-20 Next Office Visit and Type:09-04-20 Last Controlled Substance Agreement Date:02-28-20 Last UDS:02-28-20

## 2020-08-06 ENCOUNTER — Other Ambulatory Visit: Payer: Self-pay

## 2020-08-07 MED ORDER — HYDROCODONE-ACETAMINOPHEN 10-325 MG PO TABS: 1.0000 | ORAL_TABLET | Freq: Three times a day (TID) | ORAL | 0 refills | Status: DC | PRN

## 2020-08-07 NOTE — Telephone Encounter (Signed)
Name of Medication:Hydrocodone Name of Pharmacy:[facility] or Written Date and Quantity:07-10-20#75 Last Office Visit and Type:06-05-20 Next Office Visit and Type:09-04-20 Last Controlled Substance Agreement Date:02-28-20 Last UDS:02-28-20

## 2020-09-03 ENCOUNTER — Other Ambulatory Visit: Payer: Self-pay

## 2020-09-03 NOTE — Telephone Encounter (Signed)
Name of Medication: Hydrocodone-APAP Name of Pharmacy: [facility] Last Fill or Written Date and Quantity: 08/07/20, #75 Last Office Visit and Type: 06/05/20, 3 mo pain f/u Next Office Visit and Type: 09/04/20, 3 mo pain f/u Last Controlled Substance Agreement Date: 02/28/20 Last UDS: 02/28/20

## 2020-09-04 ENCOUNTER — Ambulatory Visit (INDEPENDENT_AMBULATORY_CARE_PROVIDER_SITE_OTHER): Payer: Medicare HMO | Admitting: Internal Medicine

## 2020-09-04 ENCOUNTER — Other Ambulatory Visit: Payer: Self-pay

## 2020-09-04 ENCOUNTER — Encounter: Payer: Self-pay | Admitting: Internal Medicine

## 2020-09-04 DIAGNOSIS — G8929 Other chronic pain: Secondary | ICD-10-CM

## 2020-09-04 DIAGNOSIS — M549 Dorsalgia, unspecified: Secondary | ICD-10-CM

## 2020-09-04 DIAGNOSIS — F1120 Opioid dependence, uncomplicated: Secondary | ICD-10-CM

## 2020-09-04 MED ORDER — HYDROCODONE-ACETAMINOPHEN 10-325 MG PO TABS: 1.0000 | ORAL_TABLET | Freq: Three times a day (TID) | ORAL | 0 refills | Status: DC | PRN

## 2020-09-04 NOTE — Assessment & Plan Note (Signed)
PDMP reviewed No concerns 

## 2020-09-04 NOTE — Assessment & Plan Note (Signed)
Remains functional with her job Uses the hydrocodone mostly to help sleep and if really bad
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
## 2020-10-02 ENCOUNTER — Other Ambulatory Visit: Payer: Self-pay

## 2020-10-02 MED ORDER — HYDROCODONE-ACETAMINOPHEN 10-325 MG PO TABS: 1.0000 | ORAL_TABLET | Freq: Three times a day (TID) | ORAL | 0 refills | Status: DC | PRN

## 2020-10-02 NOTE — Telephone Encounter (Signed)
Name of Medication: Hydrocodone-APAP Name of Pharmacy: [facility] Last Fill or Written Date and Quantity: 09-04-20 #75 Last Office Visit and Type: 09-04-20 3 mo pain f/u Next Office Visit and Type: 12-08-20, 3 mo pain f/u Last Controlled Substance Agreement Date: 02/28/20 Last UDS: 02/28/20

## 2020-10-09 ENCOUNTER — Other Ambulatory Visit: Payer: Self-pay | Admitting: Internal Medicine

## 2020-10-14 MED ORDER — FLUCONAZOLE 150 MG PO TABS: 150.0000 mg | ORAL_TABLET | Freq: Once | ORAL | 1 refills | Status: AC

## 2020-10-30 ENCOUNTER — Other Ambulatory Visit: Payer: Self-pay

## 2020-10-30 MED ORDER — HYDROCODONE-ACETAMINOPHEN 10-325 MG PO TABS: 1.0000 | ORAL_TABLET | Freq: Three times a day (TID) | ORAL | 0 refills | Status: DC | PRN

## 2020-10-30 NOTE — Telephone Encounter (Signed)
Name of Medication: Hydrocodone-APAP Name of Pharmacy: [facility] Last Fill or Written Date and Quantity: 10-02-20 #75 Last Office Visit and Type: 09-04-20 3 mo pain f/u Next Office Visit and Type: 12-08-20, 3 mo pain f/u Last Controlled Substance Agreement Date: 02/28/20 Last UDS: 02/28/20

## 2020-11-23 ENCOUNTER — Other Ambulatory Visit: Payer: Self-pay | Admitting: Internal Medicine

## 2020-11-24 NOTE — Telephone Encounter (Signed)
Last filled 10-26-20 #30 Last OV 09-04-20 Next OV 12-08-20 [facility]

## 2020-11-27 MED ORDER — HYDROCODONE-ACETAMINOPHEN 10-325 MG PO TABS: 1.0000 | ORAL_TABLET | Freq: Three times a day (TID) | ORAL | 0 refills | Status: DC | PRN

## 2020-11-27 NOTE — Telephone Encounter (Signed)
Name of Medication: Hydrocodone-APAP Name of Pharmacy: [facility] Last Fill or Written Date and Quantity: 10-30-20 #75 Last Office Visit and Type: 09-04-20 3 mo pain f/u Next Office Visit and Type: 12-08-20, 3 mo pain f/u Last Controlled Substance Agreement Date: 02/28/20 Last UDS: 02/28/20

## 2020-12-08 ENCOUNTER — Ambulatory Visit (INDEPENDENT_AMBULATORY_CARE_PROVIDER_SITE_OTHER): Payer: Medicare HMO | Admitting: Internal Medicine

## 2020-12-08 ENCOUNTER — Other Ambulatory Visit: Payer: Self-pay

## 2020-12-08 ENCOUNTER — Encounter: Payer: Self-pay | Admitting: Internal Medicine

## 2020-12-08 DIAGNOSIS — G8929 Other chronic pain: Secondary | ICD-10-CM

## 2020-12-08 DIAGNOSIS — M5441 Lumbago with sciatica, right side: Secondary | ICD-10-CM | POA: Diagnosis not present

## 2020-12-08 DIAGNOSIS — F1120 Opioid dependence, uncomplicated: Secondary | ICD-10-CM | POA: Diagnosis not present

## 2020-12-08 DIAGNOSIS — M5442 Lumbago with sciatica, left side: Secondary | ICD-10-CM

## 2020-12-08 DIAGNOSIS — M549 Dorsalgia, unspecified: Secondary | ICD-10-CM | POA: Diagnosis not present

## 2020-12-08 DIAGNOSIS — S40012A Contusion of left shoulder, initial encounter: Secondary | ICD-10-CM | POA: Insufficient documentation

## 2020-12-08 NOTE — Assessment & Plan Note (Signed)
PDMP reviewed No concerns 
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
## 2020-12-08 NOTE — Assessment & Plan Note (Signed)
Stable chronic back pain Maintains her good function with regular hydrocodone
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
## 2020-12-08 NOTE — Assessment & Plan Note (Deleted)
Stable chronic back pain Maintains her good function with regular hydrocodone  

## 2020-12-10 ENCOUNTER — Other Ambulatory Visit: Payer: Self-pay

## 2020-12-10 ENCOUNTER — Ambulatory Visit (INDEPENDENT_AMBULATORY_CARE_PROVIDER_SITE_OTHER): Payer: Medicare HMO

## 2020-12-10 ENCOUNTER — Ambulatory Visit
Admission: EM | Admit: 2020-12-10 | Discharge: 2020-12-10 | Disposition: A | Discharge status: Home / Self Care | Payer: Medicare HMO | Attending: Sports Medicine | Admitting: Sports Medicine

## 2020-12-10 DIAGNOSIS — R079 Chest pain, unspecified: Secondary | ICD-10-CM

## 2020-12-10 DIAGNOSIS — S2341XA Sprain of ribs, initial encounter: Secondary | ICD-10-CM

## 2020-12-10 DIAGNOSIS — S40012A Contusion of left shoulder, initial encounter: Secondary | ICD-10-CM | POA: Diagnosis not present

## 2020-12-10 DIAGNOSIS — M25512 Pain in left shoulder: Secondary | ICD-10-CM | POA: Diagnosis not present

## 2020-12-10 DIAGNOSIS — Z041 Encounter for examination and observation following transport accident: Secondary | ICD-10-CM | POA: Diagnosis not present

## 2020-12-10 MED ORDER — BACLOFEN 10 MG PO TABS: 10.0000 mg | ORAL_TABLET | Freq: Three times a day (TID) | ORAL | 0 refills | Status: DC

## 2020-12-10 NOTE — Discharge Instructions (Addendum)
Use the baclofen every 8 hours as needed for muscle pain and spasm.  Apply ice to your left shoulder and chest wall for 20 minutes at a time, 2-3 times a day, as needed for pain and inflammation.  If your pain continues follow-up with your primary care provider.
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
## 2020-12-26 ENCOUNTER — Other Ambulatory Visit: Payer: Self-pay

## 2020-12-26 MED ORDER — HYDROCODONE-ACETAMINOPHEN 10-325 MG PO TABS: 1.0000 | ORAL_TABLET | Freq: Three times a day (TID) | ORAL | 0 refills | Status: DC | PRN

## 2020-12-26 NOTE — Telephone Encounter (Signed)
Name of Medication: Hydrocodone Name of Pharmacy: [facility] or Written Date and Quantity: 11-27-20 #75 Last Office Visit and Type: 12-08-20 Next Office Visit and Type: 03-12-21 Last Controlled Substance Agreement Date: 02-28-20 Last UDS: 02-28-20

## 2020-12-31 ENCOUNTER — Telehealth: Payer: Medicare HMO | Admitting: Internal Medicine

## 2021-01-19 ENCOUNTER — Other Ambulatory Visit: Payer: Self-pay

## 2021-01-20 MED ORDER — HYDROCODONE-ACETAMINOPHEN 10-325 MG PO TABS: 1.0000 | ORAL_TABLET | Freq: Three times a day (TID) | ORAL | 0 refills | Status: DC | PRN

## 2021-01-20 NOTE — Telephone Encounter (Signed)
Name of Medication: Hydrocodone Name of Pharmacy: [facility] or Written Date and Quantity: 12-26-20 #75 Last Office Visit and Type: 12-08-20 Next Office Visit and Type: 03-12-21 Last Controlled Substance Agreement Date: 02-28-20 Last UDS: 02-28-20

## 2021-02-16 ENCOUNTER — Other Ambulatory Visit: Payer: Self-pay

## 2021-02-17 MED ORDER — HYDROCODONE-ACETAMINOPHEN 10-325 MG PO TABS: 1.0000 | ORAL_TABLET | Freq: Three times a day (TID) | ORAL | 0 refills | Status: DC | PRN

## 2021-02-17 NOTE — Telephone Encounter (Signed)
Name of Medication: Hydrocodone Name of Pharmacy: [facility] or Written Date and Quantity: 01-20-21 #75 Last Office Visit and Type: 12-08-20 Next Office Visit and Type: 03-12-21 Last Controlled Substance Agreement Date: 02-28-20 Last UDS: 02-28-20

## 2021-03-12 ENCOUNTER — Ambulatory Visit: Payer: Medicare HMO | Admitting: Internal Medicine

## 2021-03-16 ENCOUNTER — Other Ambulatory Visit: Payer: Self-pay | Admitting: Internal Medicine

## 2021-03-16 MED ORDER — HYDROCODONE-ACETAMINOPHEN 10-325 MG PO TABS: 1.0000 | ORAL_TABLET | Freq: Three times a day (TID) | ORAL | 0 refills | Status: DC | PRN

## 2021-03-16 NOTE — Telephone Encounter (Signed)
Name of Medication: Hydrocodone Name of Pharmacy: [facility] or Written Date and Quantity: 02-17-21 #75 Last Office Visit and Type: 12-08-20 Next Office Visit and Type: 04-21-21 Last Controlled Substance Agreement Date: 02-28-20 Last UDS: 02-28-20

## 2021-03-19 ENCOUNTER — Other Ambulatory Visit: Payer: Self-pay | Admitting: Internal Medicine

## 2021-03-24 ENCOUNTER — Encounter: Payer: Self-pay | Admitting: Internal Medicine

## 2021-03-25 MED ORDER — CYCLOBENZAPRINE HCL 10 MG PO TABS: 5.0000 mg | ORAL_TABLET | Freq: Every evening | ORAL | 1 refills | Status: DC | PRN

## 2021-04-10 ENCOUNTER — Encounter: Payer: Self-pay | Admitting: Internal Medicine

## 2021-04-10 ENCOUNTER — Other Ambulatory Visit: Payer: Self-pay | Admitting: Internal Medicine

## 2021-04-10 MED ORDER — HYDROCODONE-ACETAMINOPHEN 10-325 MG PO TABS: 1.0000 | ORAL_TABLET | Freq: Three times a day (TID) | ORAL | 0 refills | Status: DC | PRN

## 2021-04-10 NOTE — Telephone Encounter (Signed)
Per pt's MyChart Message: Catherine Griffin, I received your call about how many of the hydrocodone I have left. I do still have 10 of them. So its no hurry...just needing to refill before Tuesday when I leave for small vacation. Thanks.

## 2021-04-10 NOTE — Telephone Encounter (Signed)
Name of Medication: Hydrocodone Name of Pharmacy: [facility] or Written Date and Quantity: 03-16-21 #75 Last Office Visit and Type: 12-08-20 Next Office Visit and Type: 04-21-21 Last Controlled Substance Agreement Date: 02-28-20 Last UDS: 02-28-20  Last few written dates: 02-17-21, 03-16-21  Left message for pt to call back or send MyChart message to see how many she has left.

## 2021-04-13 ENCOUNTER — Encounter: Payer: Self-pay | Admitting: Internal Medicine

## 2021-04-14 ENCOUNTER — Other Ambulatory Visit: Payer: Self-pay

## 2021-04-14 ENCOUNTER — Encounter: Payer: Self-pay | Admitting: Internal Medicine

## 2021-04-14 ENCOUNTER — Ambulatory Visit (INDEPENDENT_AMBULATORY_CARE_PROVIDER_SITE_OTHER): Payer: Medicare Other | Admitting: Internal Medicine

## 2021-04-14 VITALS — BP 96/72 | HR 84 | Temp 97.2°F | Ht 62.0 in | Wt 127.0 lb

## 2021-04-14 DIAGNOSIS — R634 Abnormal weight loss: Secondary | ICD-10-CM | POA: Diagnosis not present

## 2021-04-14 DIAGNOSIS — G8929 Other chronic pain: Secondary | ICD-10-CM

## 2021-04-14 DIAGNOSIS — F17200 Nicotine dependence, unspecified, uncomplicated: Secondary | ICD-10-CM | POA: Diagnosis not present

## 2021-04-14 DIAGNOSIS — E039 Hypothyroidism, unspecified: Secondary | ICD-10-CM

## 2021-04-14 DIAGNOSIS — M549 Dorsalgia, unspecified: Secondary | ICD-10-CM | POA: Diagnosis not present

## 2021-04-14 DIAGNOSIS — F1120 Opioid dependence, uncomplicated: Secondary | ICD-10-CM

## 2021-04-14 LAB — TSH: TSH: 1.18 u[IU]/mL (ref 0.35–5.50)

## 2021-04-14 LAB — CBC
HCT: 41.1 % (ref 36.0–46.0)
Hemoglobin: 13.8 g/dL (ref 12.0–15.0)
MCHC: 33.5 g/dL (ref 30.0–36.0)
MCV: 93.7 fl (ref 78.0–100.0)
Platelets: 231 10*3/uL (ref 150.0–400.0)
RBC: 4.38 Mil/uL (ref 3.87–5.11)
RDW: 14.6 % (ref 11.5–15.5)
WBC: 8.6 10*3/uL (ref 4.0–10.5)

## 2021-04-14 LAB — COMPREHENSIVE METABOLIC PANEL
ALT: 5 U/L (ref 0–35)
AST: 15 U/L (ref 0–37)
Albumin: 3.7 g/dL (ref 3.5–5.2)
Alkaline Phosphatase: 54 U/L (ref 39–117)
BUN: 16 mg/dL (ref 6–23)
CO2: 26 mEq/L (ref 19–32)
Calcium: 8.8 mg/dL (ref 8.4–10.5)
Chloride: 104 mEq/L (ref 96–112)
Creatinine, Ser: 0.72 mg/dL (ref 0.40–1.20)
GFR: 86.36 mL/min (ref >60.00)
Glucose, Bld: 78 mg/dL (ref 70–99)
Potassium: 3.6 mEq/L (ref 3.5–5.1)
Sodium: 138 mEq/L (ref 135–145)
Total Bilirubin: 0.2 mg/dL (ref 0.2–1.2)
Total Protein: 6.5 g/dL (ref 6.0–8.3)

## 2021-04-14 LAB — SEDIMENTATION RATE: Sed Rate: 20 mm/hr (ref 0–30)

## 2021-04-14 LAB — T4, FREE: Free T4: 0.81 ng/dL (ref 0.60–1.60)

## 2021-04-14 NOTE — Assessment & Plan Note (Signed)
Does wake her up at night at times Still able to work Uses the hydrocodone
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
## 2021-04-14 NOTE — Assessment & Plan Note (Signed)
PDMP reviewed No concerns 

## 2021-04-14 NOTE — Assessment & Plan Note (Signed)
Appetite is good and feels well Will refer for lung cancer screening Check labs UTD on other cancer screening

## 2021-04-14 NOTE — Assessment & Plan Note (Signed)
Seems euthyroid other than the weight loss Will check labs on the dose

## 2021-04-14 NOTE — Assessment & Plan Note (Signed)
Advised her to go back on patch (which has helped) and lozenge prn Continue the patch indefinitely if necessary--if she quits again

## 2021-04-19 LAB — DRUG MONITORING, PANEL 8 WITH CONFIRMATION, URINE
6 Acetylmorphine: NEGATIVE ng/mL (ref <10)
Alcohol Metabolites: NEGATIVE ng/mL (ref <500)
Amphetamines: NEGATIVE ng/mL (ref <500)
Benzodiazepines: NEGATIVE ng/mL (ref <100)
Buprenorphine, Urine: NEGATIVE ng/mL (ref <5)
Cocaine Metabolite: NEGATIVE ng/mL (ref <150)
Codeine: NEGATIVE ng/mL (ref <50)
Creatinine: 58.3 mg/dL (ref >=20.0)
Hydrocodone: 1751 ng/mL — ABNORMAL HIGH (ref <50)
Hydromorphone: 1190 ng/mL — ABNORMAL HIGH (ref <50)
MDMA: NEGATIVE ng/mL (ref <500)
Marijuana Metabolite: 8 ng/mL — ABNORMAL HIGH (ref <5)
Marijuana Metabolite: POSITIVE ng/mL — AB (ref <20)
Morphine: NEGATIVE ng/mL (ref <50)
Norhydrocodone: 2848 ng/mL — ABNORMAL HIGH (ref <50)
Opiates: POSITIVE ng/mL — AB (ref <100)
Oxidant: NEGATIVE ug/mL (ref <200)
Oxycodone: NEGATIVE ng/mL (ref <100)
pH: 5 (ref 4.5–9.0)

## 2021-04-19 LAB — DM TEMPLATE

## 2021-04-21 ENCOUNTER — Ambulatory Visit: Payer: Medicare HMO | Admitting: Internal Medicine

## 2021-05-06 ENCOUNTER — Encounter: Payer: Self-pay | Admitting: Internal Medicine

## 2021-05-06 NOTE — Telephone Encounter (Signed)
Name of Medication: Hydrocodone Name of Pharmacy: [facility] or Written Date and Quantity: 04-10-21 #75 Last Office Visit and Type: 04-14-21 Next Office Visit and Type: 07-13-21 Last Controlled Substance Agreement Date: 04-14-21 Last UDS: 04-14-21   Last few written dates: 02-17-21, 03-16-21, 04-10-21

## 2021-05-07 MED ORDER — HYDROCODONE-ACETAMINOPHEN 10-325 MG PO TABS: 1.0000 | ORAL_TABLET | Freq: Three times a day (TID) | ORAL | 0 refills | Status: DC | PRN

## 2021-05-17 ENCOUNTER — Other Ambulatory Visit: Payer: Self-pay | Admitting: Internal Medicine

## 2021-05-19 ENCOUNTER — Other Ambulatory Visit: Payer: Self-pay | Admitting: Internal Medicine

## 2021-06-03 ENCOUNTER — Other Ambulatory Visit: Payer: Self-pay | Admitting: Internal Medicine

## 2021-06-04 MED ORDER — HYDROCODONE-ACETAMINOPHEN 10-325 MG PO TABS: 1.0000 | ORAL_TABLET | Freq: Three times a day (TID) | ORAL | 0 refills | Status: DC | PRN

## 2021-06-04 NOTE — Telephone Encounter (Signed)
Name of Medication: Hydrocodone Name of [facility]acy: [facility] or Written Date and Quantity: 05-07-21 #75 Last Office Visit and Type: 04-14-21 Next Office Visit and Type: 07-13-21 Last Controlled Substance Agreement Date: 04-14-21 Last UDS: 04-14-21   Last few written dates: 02-17-21, 03-16-21, 04-10-21  Seems to be earlier and earlier every month.

## 2021-06-30 ENCOUNTER — Other Ambulatory Visit: Payer: Self-pay | Admitting: Internal Medicine

## 2021-07-01 ENCOUNTER — Other Ambulatory Visit: Payer: Self-pay | Admitting: Internal Medicine

## 2021-07-01 MED ORDER — HYDROCODONE-ACETAMINOPHEN 10-325 MG PO TABS: 1.0000 | ORAL_TABLET | Freq: Three times a day (TID) | ORAL | 0 refills | Status: DC | PRN

## 2021-07-01 NOTE — Telephone Encounter (Signed)
Name of Medication: Hydrocodone ?Name of [facility]acy: [facility] ?Last Fill or Written Date and Quantity: 06-04-21 #75 ?Last Office Visit and Type: 04-14-21 ?Next Office Visit and Type: 07-13-21 ?Last Controlled Substance Agreement Date: 04-14-21 ?Last UDS: 04-14-21 ?  ?Last few written dates: 02-17-21, 03-16-21, 04-10-21, 05-07-21, 06-04-21 ?  ?Seems to be earlier and earlier every month. ?

## 2021-07-06 ENCOUNTER — Encounter: Payer: Self-pay | Admitting: Internal Medicine

## 2021-07-06 MED ORDER — FLUCONAZOLE 150 MG PO TABS: 150.0000 mg | ORAL_TABLET | Freq: Once | ORAL | 1 refills | Status: AC

## 2021-07-13 ENCOUNTER — Ambulatory Visit (INDEPENDENT_AMBULATORY_CARE_PROVIDER_SITE_OTHER): Payer: Medicare Other | Admitting: Internal Medicine

## 2021-07-13 ENCOUNTER — Encounter: Payer: Self-pay | Admitting: Internal Medicine

## 2021-07-13 VITALS — BP 100/68 | HR 80 | Temp 97.6°F | Ht 62.0 in | Wt 126.0 lb

## 2021-07-13 DIAGNOSIS — Z0000 Encounter for general adult medical examination without abnormal findings: Secondary | ICD-10-CM | POA: Diagnosis not present

## 2021-07-13 DIAGNOSIS — G8929 Other chronic pain: Secondary | ICD-10-CM

## 2021-07-13 DIAGNOSIS — M549 Dorsalgia, unspecified: Secondary | ICD-10-CM | POA: Diagnosis not present

## 2021-07-13 DIAGNOSIS — J449 Chronic obstructive pulmonary disease, unspecified: Secondary | ICD-10-CM

## 2021-07-13 DIAGNOSIS — F1120 Opioid dependence, uncomplicated: Secondary | ICD-10-CM | POA: Diagnosis not present

## 2021-07-13 DIAGNOSIS — E039 Hypothyroidism, unspecified: Secondary | ICD-10-CM

## 2021-07-13 DIAGNOSIS — Z7189 Other specified counseling: Secondary | ICD-10-CM

## 2021-07-13 NOTE — Assessment & Plan Note (Signed)
PDMP reviewed No concerns 

## 2021-07-13 NOTE — Assessment & Plan Note (Addendum)
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 ?

## 2021-07-13 NOTE — Assessment & Plan Note (Signed)
See social history 

## 2021-07-13 NOTE — Assessment & Plan Note (Signed)
Mild symptoms now ?Has stopped smoking over past 2 months ?

## 2021-07-13 NOTE — Progress Notes (Signed)
? ?Subjective:  ? ? Patient ID: Catherine Griffin, female    DOB: Apr 15, 1955, 66 y.o.   MRN: [id number] ? 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 Walks some ?Vision is okay--needs exam ?Mild hearing loss ?Independent with instrumental ADLs ?Memory seems fine ? 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 ?Did quit cigarettes in February ?Only slipped 3 times since then ?No sig cough---just some throat clearing ?Never got back about the lung cancer screening--was busy ?No wheezing ?Breathing is better---since has been smoking all the time before ? ?Back pain is persistent---may be worse depending on her activity ?Takes the hydrocodone 2.5-3 times a day ?Using at night some now to help sleep ? ?Current Outpatient Medications on File Prior to Visit  ?Medication Sig Dispense Refill  ? aspirin 81 MG tablet Take 81 mg by mouth daily.    ? cholecalciferol (VITAMIN D) 1000 UNITS tablet Take 1,000 Units by mouth daily.    ? cyclobenzaprine (FLEXERIL) 10 MG tablet TAKE 0.5-1 TABLETS (5-10 MG TOTAL) BY MOUTH AT BEDTIME AS NEEDED. 30 tablet 1  ? HYDROcodone-acetaminophen (NORCO) 10-325 MG tablet Take 1 tablet by mouth 3 (three) times daily as needed. 75 tablet 0  ? levothyroxine (SYNTHROID) 50 MCG tablet TAKE 1 TABLET BY MOUTH EVERY DAY BEFORE BREAKFAST 90 tablet 3  ? ?No current facility-administered medications on file prior to visit.  ? ? ?Allergies  ?Allergen Reactions  ? Codeine Phosphate [Codeine] Nausea Only  ? Indomethacin   ?  REACTION: Rash/hives/swelling  ? ? ?Past Medical History:  ?Diagnosis Date  ? Abdominal pain    ? Abdominal pain, right upper quadrant   ? Actinic keratosis   ? 04/25/07  ? Aphthous ulcer of mouth   ? Arthritis   ? Back pain   ? 04/25/07  ? Chest pain   ? 04/25/07  ? Diverticulosis of colon   ? 02/28/09  ? Fatigue   ? 05/30/08  ? Hemorrhoid   ? 12/01/10  ? Hepatitis C   ? Hyperlipidemia   ? Hypothyroidism   ? Hypothyroidism   ? Lichen sclerosus   ? Osteopenia   ? Osteoporosis   ? 04/25/07  ? RUQ abdominal pain   ? 02/07/09  ? Spinal stenosis   ? Thyroid disease   ? hypothyrodism  ? Vaginitis   ? 10/01/10  ? Wears dentures   ? partial upper and lower  ? ? ?Past Surgical History:  ?Procedure Laterality Date  ? ABDOMINAL HYSTERECTOMY    ? COLONOSCOPY WITH PROPOFOL N/A 06/21/2017  ? Procedure: COLONOSCOPY WITH PROPOFOL;  Surgeon: Pasty Spillers, MD;  Location: [facility];  Service: Endoscopy;  Laterality: N/A;  ? ESOPHAGOGASTRODUODENOSCOPY (EGD) WITH PROPOFOL N/A 06/21/2017  ? Procedure: ESOPHAGOGASTRODUODENOSCOPY (EGD) WITH PROPOFOL;  Surgeon: Pasty Spillers, MD;  Location: [facility];  Service: Endoscopy;  Laterality: N/A;  ? TONSILLECTOMY    ? ? ?Family History  ?Problem Relation Age of Onset  ? Heart disease Father   ? Arthritis Father   ? Other Father   ?     joint replacement  ? Cancer Mother   ?     lung  ? Anemia  Sister   ? Other Sister   ?     CHF  ? Breast cancer Sister 77  ? Cancer Sister   ?     1 sister died of metastatic cancer  ? Congestive Heart Failure Sister   ? Cancer Brother   ?     lung cancer  ? Coronary artery disease Neg Hx   ? Diabetes Neg Hx   ? ? ?Social History  ? ?Socioeconomic History  ? Marital status: Married  ?  Spouse name: Not on file  ? Number of children: Not on file  ? Years of education: Not on file  ? Highest education level: Not on file  ?Occupational History  ? Occupation: runs pool service for 7 locations  ?  Employer: self  ?Tobacco Use  ? Smoking status: Former  ?  Packs/day: 1.00  ?  Types: Cigarettes  ?  Quit date: 05/13/2021  ?  Years since  quitting: 0.1  ?  Passive exposure: Past  ? Smokeless tobacco: Never  ? Tobacco comments:  ?  since age 38  ?Vaping Use  ? Vaping Use: Never used  ?Substance and Sexual Activity  ? Alcohol use: No  ?  Alcohol/week: 0.0 standard drinks  ? Drug use: No  ? Sexual activity: Not on file  ?Other Topics Concern  ? Not on file  ?Social History Narrative  ? 2 step children  ?   ? Has living will  ? Health care POA is husband---sister [name] would be alternate  ? Would accept resuscitation  ? No prolonged tube feeds if cognitively unaware  ? ?Social Determinants of Health  ? ?Financial Resource Strain: Not on file  ?Food Insecurity: Not on file  ?Transportation Needs: Not on file  ?Physical Activity: Not on file  ?Stress: Not on file  ?Social Connections: Not on file  ?Intimate Partner Violence: Not on file  ? ?Review of Systems ?Appetite is good ?Weight stable now ?Teeth are okay---sees dentist ?No suspicious skin lesions ?No chest pain ?Does hear her heart in an ear at night--able to flip over. Not fast ?No dizziness or syncope ?No edema ?Bowels move okay--no blood ?No dysuria or hematuria ?No other joint pains ?   ?Objective:  ? Physical Exam ?Constitutional:   ?   Appearance: Normal appearance.  ?HENT:  ?   Mouth/Throat:  ?   Comments: No lesions ?Eyes:  ?   Conjunctiva/sclera: Conjunctivae normal.  ?   Pupils: Pupils are equal, round, and reactive to light.  ?Cardiovascular:  ?   Rate and Rhythm: Normal rate and regular rhythm.  ?   Pulses: Normal pulses.  ?   Heart sounds: No murmur heard. ?  No gallop.  ?Pulmonary:  ?   Effort: Pulmonary effort is normal.  ?   Breath sounds: No wheezing or rales.  ?   Comments: Slightly decreased breath sounds but clear ?Abdominal:  ?   Palpations: Abdomen is soft.  ?   Tenderness: There is no abdominal tenderness.  ?Musculoskeletal:  ?   Cervical back: Neck supple.  ?   Right lower leg: No edema.  ?   Left lower leg: No edema.  ?Lymphadenopathy:  ?   Cervical: No cervical adenopathy.   ?Skin: ?   Findings: No lesion or rash.  ?Neurological:  ?   General: No focal deficit present.  ?   Mental Status: She is alert and oriented to person, place, and time.  ?   Comments: Mini-cog---clock normal,  recall 2/3  ?Psychiatric:     ?   Mood and Affect: Mood normal.     ?   Behavior: Behavior normal.  ?  ? ? ? ? ?   ?Assessment & Plan:  ? ?

## 2021-07-13 NOTE — Assessment & Plan Note (Signed)
I have personally reviewed the Medicare Annual Wellness questionnaire and have noted ?1. The patient's medical and social history ?2. Their use of alcohol, tobacco or illicit drugs ?3. Their current medications and supplements ?4. The patient's functional ability including ADL's, fall risks, home safety risks and hearing or visual ?            impairment. ?5. Diet and physical activities ?6. Evidence for depression or mood disorders ? ?The patients weight, height, BMI and visual acuity have been recorded in the chart ?I have made referrals, counseling and provided education to the patient based review of the above and I have provided the pt with a written personalized care plan for preventive services. ? ?I have provided you with a copy of your personalized plan for preventive services. Please take the time to review along with your updated medication list. ? ?Colonoscopy due 2029 ?No pap due to age and hyster ?Mammogram due by November ?Discussed increasing exercise ?Urged her to set up the lung cancer screening ?Recommended flu vaccine in the fall ?Td and shingrix at the pharmacy ?

## 2021-07-13 NOTE — Assessment & Plan Note (Signed)
Euthyroid on levothyroxine 50 daily ?

## 2021-08-02 ENCOUNTER — Other Ambulatory Visit: Payer: Self-pay | Admitting: Internal Medicine

## 2021-08-03 MED ORDER — HYDROCODONE-ACETAMINOPHEN 10-325 MG PO TABS: 1.0000 | ORAL_TABLET | Freq: Three times a day (TID) | ORAL | 0 refills | Status: DC | PRN

## 2021-08-03 NOTE — Telephone Encounter (Signed)
Name of Medication: Hydrocodone ?Name of [facility]acy: [facility] ?Last Fill or Written Date and Quantity: 07-01-21 #75 ?Last Office Visit and Type: 07-13-21 ?Next Office Visit and Type: 10-12-21 ?Last Controlled Substance Agreement Date: 04-14-21 ?Last UDS: 04-14-21 ?  ? ?

## 2021-08-30 ENCOUNTER — Other Ambulatory Visit: Payer: Self-pay | Admitting: Internal Medicine

## 2021-08-31 MED ORDER — HYDROCODONE-ACETAMINOPHEN 10-325 MG PO TABS: 1.0000 | ORAL_TABLET | Freq: Three times a day (TID) | ORAL | 0 refills | Status: DC | PRN

## 2021-08-31 NOTE — Telephone Encounter (Signed)
Name of Medication: Hydrocodone Name of [facility]acy: [facility] or Written Date and Quantity: 08-03-21 #75 Last Office Visit and Type: 07-13-21 Next Office Visit and Type: 10-12-21 Last Controlled Substance Agreement Date: 04-14-21 Last UDS: 04-14-21

## 2021-09-12 ENCOUNTER — Other Ambulatory Visit: Payer: Self-pay | Admitting: Internal Medicine

## 2021-09-29 ENCOUNTER — Other Ambulatory Visit: Payer: Self-pay | Admitting: Internal Medicine

## 2021-09-30 MED ORDER — HYDROCODONE-ACETAMINOPHEN 10-325 MG PO TABS: 1.0000 | ORAL_TABLET | Freq: Three times a day (TID) | ORAL | 0 refills | Status: DC | PRN

## 2021-10-05 ENCOUNTER — Encounter: Payer: Self-pay | Admitting: Internal Medicine

## 2021-10-06 ENCOUNTER — Encounter: Payer: Self-pay | Admitting: Internal Medicine

## 2021-10-06 MED ORDER — HYDROCODONE-ACETAMINOPHEN 10-325 MG PO TABS: 1.0000 | ORAL_TABLET | Freq: Three times a day (TID) | ORAL | 0 refills | Status: DC | PRN

## 2021-10-12 ENCOUNTER — Telehealth (INDEPENDENT_AMBULATORY_CARE_PROVIDER_SITE_OTHER): Payer: Medicare Other | Admitting: Internal Medicine

## 2021-10-12 ENCOUNTER — Encounter: Payer: Self-pay | Admitting: Internal Medicine

## 2021-10-12 DIAGNOSIS — F1120 Opioid dependence, uncomplicated: Secondary | ICD-10-CM | POA: Diagnosis not present

## 2021-10-12 DIAGNOSIS — M549 Dorsalgia, unspecified: Secondary | ICD-10-CM | POA: Diagnosis not present

## 2021-10-12 DIAGNOSIS — G8929 Other chronic pain: Secondary | ICD-10-CM | POA: Diagnosis not present

## 2021-10-12 NOTE — Progress Notes (Signed)
Subjective:    Patient ID: Catherine Griffin, female    DOB: February 28, 1956, 66 y.o.   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     cholecalciferol (VITAMIN D) 1000 UNITS tablet Take 1,000 Units by mouth daily.     cyclobenzaprine (FLEXERIL) 10 MG tablet TAKE 0.5-1 TABLETS (5-10 MG TOTAL) BY MOUTH AT BEDTIME AS NEEDED. 30 tablet 0   HYDROcodone-acetaminophen (NORCO) 10-325 MG tablet Take 1 tablet by mouth 3 (three) times daily as needed. 75 tablet 0   levothyroxine (SYNTHROID) 50 MCG tablet TAKE 1 TABLET BY MOUTH EVERY DAY BEFORE BREAKFAST 90 tablet 3   No current facility-administered medications on file prior to visit.    Allergies  Allergen Reactions   Codeine Phosphate [Codeine] Nausea Only   Indomethacin     REACTION: Rash/hives/swelling    Past Medical History:  Diagnosis Date   Abdominal pain    Abdominal pain, right upper quadrant    Actinic keratosis    04/25/07   Aphthous ulcer of mouth    Arthritis    Back pain    04/25/07   Chest pain    04/25/07   Diverticulosis of colon    02/28/09   Fatigue    05/30/08   Hemorrhoid    12/01/10   Hepatitis C    Hyperlipidemia    Hypothyroidism    Hypothyroidism    Lichen sclerosus    Osteopenia    Osteoporosis    04/25/07   RUQ abdominal pain    02/07/09   Spinal stenosis    Thyroid disease    hypothyrodism   Vaginitis    10/01/10   Wears dentures    partial upper and lower    Past Surgical History:  Procedure  Laterality Date   ABDOMINAL HYSTERECTOMY     COLONOSCOPY WITH PROPOFOL N/A 06/21/2017   Procedure: COLONOSCOPY WITH PROPOFOL;  Surgeon: Pasty Spillers, MD;  Location: [facility];  Service: Endoscopy;  Laterality: N/A;   ESOPHAGOGASTRODUODENOSCOPY (EGD) WITH PROPOFOL N/A 06/21/2017   Procedure: ESOPHAGOGASTRODUODENOSCOPY (EGD) WITH PROPOFOL;  Surgeon: Pasty Spillers, MD;  Location: [facility];  Service: Endoscopy;  Laterality: N/A;   TONSILLECTOMY      Family History  Problem Relation Age of Onset   Heart disease Father    Arthritis Father    Other Father        joint replacement   Cancer Mother        lung   Anemia Sister    Other Sister        CHF   Breast cancer Sister 26   Cancer Sister        1 sister died of metastatic cancer   Congestive Heart Failure Sister    Cancer Brother        lung cancer   Coronary artery disease Neg Hx    Diabetes Neg Hx     Social History   Socioeconomic History   Marital status: Married  Spouse name: Not on file   Number of children: Not on file   Years of education: Not on file   Highest education level: Not on file  Occupational History   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  Tobacco Use   Smoking status: Former    Packs/day: 1.00    Types: Cigarettes    Quit date: 05/13/2021    Years since quitting: 0.4    Passive exposure: Past   Smokeless tobacco: Never   Tobacco comments:    since age 52  Vaping Use   Vaping Use: Never used  Substance and Sexual Activity   Alcohol use: No    Alcohol/week: 0.0 standard drinks of alcohol   Drug use: No   Sexual activity: Not on file  Other Topics Concern   Not on file  Social History Narrative   2 step children      Has living will   Health care POA is husband---sister [name] would be alternate   Would accept resuscitation   No prolonged tube feeds if cognitively unaware   Social Determinants of Health   Financial Resource  Strain: Not on file  Food Insecurity: Not on file  Transportation Needs: Not on file  Physical Activity: Not on file  Stress: Not on file  Social Connections: Not on file  Intimate Partner Violence: Not on file   Review of Systems Sleeping okay Also working on porch and yard with husband---like mulching Bowels move fine--with stool softener    Objective:   Physical Exam Constitutional:      Appearance: Normal appearance.  Pulmonary:     Effort: Pulmonary effort is normal. No respiratory distress.  Neurological:     Mental Status: She is alert.  Psychiatric:        Mood and Affect: Mood normal.        Behavior: Behavior normal.            Assessment & Plan:

## 2021-10-12 NOTE — Assessment & Plan Note (Signed)
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

## 2021-10-12 NOTE — Assessment & Plan Note (Signed)
PDMP reviewed No concerns 

## 2021-10-16 ENCOUNTER — Other Ambulatory Visit: Payer: Self-pay | Admitting: Internal Medicine

## 2021-10-31 ENCOUNTER — Other Ambulatory Visit: Payer: Self-pay | Admitting: Internal Medicine

## 2021-11-03 ENCOUNTER — Other Ambulatory Visit: Payer: Self-pay | Admitting: Internal Medicine

## 2021-11-03 NOTE — Telephone Encounter (Signed)
Was this supposed to go to Dr Alphonsus Sias or is he out  How does he feel about covering docs refilling narcotics?

## 2021-11-04 ENCOUNTER — Encounter: Payer: Self-pay | Admitting: Internal Medicine

## 2021-11-04 MED ORDER — HYDROCODONE-ACETAMINOPHEN 10-325 MG PO TABS: 1.0000 | ORAL_TABLET | Freq: Three times a day (TID) | ORAL | 0 refills | Status: DC | PRN

## 2021-11-04 NOTE — Telephone Encounter (Signed)
Name of Medication: Hydrocodone Name of [facility] or Written Date and Quantity: 10-06-21 #75 Last Office Visit and Type: 10-12-21 Next Office Visit and Type: 01-06-22 Last Controlled Substance Agreement Date: 04-14-21 Last UDS: 04-14-21

## 2021-11-04 NOTE — Telephone Encounter (Signed)
Duplicate request

## 2021-11-12 ENCOUNTER — Other Ambulatory Visit: Payer: Self-pay | Admitting: Internal Medicine

## 2021-12-02 ENCOUNTER — Other Ambulatory Visit: Payer: Self-pay | Admitting: Internal Medicine

## 2021-12-02 MED ORDER — HYDROCODONE-ACETAMINOPHEN 10-325 MG PO TABS: 1.0000 | ORAL_TABLET | Freq: Three times a day (TID) | ORAL | 0 refills | Status: DC | PRN

## 2021-12-02 NOTE — Telephone Encounter (Signed)
Name of Medication: Hydrocodone Name of [facility]: [facility] or Written Date and Quantity: 11/04/21 #75 Last Office Visit and Type: video 10/12/21 Next Office Visit and Type: 01/06/22 Last Controlled Substance Agreement Date: 04/14/21 Last UDS:04/14/21

## 2021-12-29 ENCOUNTER — Other Ambulatory Visit: Payer: Self-pay | Admitting: Internal Medicine

## 2021-12-30 MED ORDER — HYDROCODONE-ACETAMINOPHEN 10-325 MG PO TABS: 1.0000 | ORAL_TABLET | Freq: Three times a day (TID) | ORAL | 0 refills | Status: DC | PRN

## 2021-12-30 NOTE — Telephone Encounter (Signed)
Name of Medication: Hydrocodone [facility] of [facility]: [facility] or Written Date and Quantity: 11/01/21 #75 Last Office Visit and Type: video 10/12/21 Next Office Visit and Type: 01/06/22 Last Controlled Substance Agreement Date: 04/14/21 Last UDS:04/14/21

## 2022-01-06 ENCOUNTER — Ambulatory Visit (INDEPENDENT_AMBULATORY_CARE_PROVIDER_SITE_OTHER): Payer: Medicare Other | Admitting: Internal Medicine

## 2022-01-06 ENCOUNTER — Encounter: Payer: Self-pay | Admitting: Internal Medicine

## 2022-01-06 DIAGNOSIS — M549 Dorsalgia, unspecified: Secondary | ICD-10-CM

## 2022-01-06 DIAGNOSIS — G8929 Other chronic pain: Secondary | ICD-10-CM

## 2022-01-06 DIAGNOSIS — F1120 Opioid dependence, uncomplicated: Secondary | ICD-10-CM | POA: Diagnosis not present

## 2022-01-06 NOTE — Assessment & Plan Note (Signed)
Reasonable control Remains functional with the hydrocodone and flexeril

## 2022-01-06 NOTE — Assessment & Plan Note (Signed)
PDMP reviewed No concerns 

## 2022-01-06 NOTE — Progress Notes (Signed)
Subjective:    Patient ID: Catherine Griffin, female    DOB: Jan 25, 1956, 65 y.o.   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 Rarely during the day  Current Outpatient Medications on File Prior to Visit  Medication Sig Dispense Refill   aspirin 81 MG tablet Take 81 mg by mouth daily.     cholecalciferol (VITAMIN D) 1000 UNITS tablet Take 1,000 Units by mouth daily.     cyclobenzaprine (FLEXERIL) 10 MG tablet TAKE 1/2-1 TABLET (5-10 MG TOTAL) BY MOUTH AT BEDTIME AS NEEDED. 30 tablet 1   HYDROcodone-acetaminophen (NORCO) 10-325 MG tablet Take 1 tablet by mouth 3 (three) times daily as needed. 75 tablet 0   levothyroxine (SYNTHROID) 50 MCG tablet TAKE 1 TABLET BY MOUTH EVERY DAY BEFORE BREAKFAST 90 tablet 3   No current facility-administered medications on file prior to visit.    Allergies  Allergen Reactions   Codeine Phosphate [Codeine] Nausea Only   Indomethacin     REACTION: Rash/hives/swelling    Past Medical History:  Diagnosis Date   Abdominal pain    Abdominal pain, right upper quadrant    Actinic keratosis    04/25/07   Aphthous ulcer of mouth    Arthritis    Back pain    04/25/07   Chest pain    04/25/07   Diverticulosis of colon    02/28/09   Fatigue    05/30/08   Hemorrhoid    12/01/10   Hepatitis C    Hyperlipidemia    Hypothyroidism    Hypothyroidism    Lichen sclerosus    Osteopenia    Osteoporosis    04/25/07   RUQ abdominal pain    02/07/09   Spinal stenosis    Thyroid disease    hypothyrodism   Vaginitis    10/01/10   Wears dentures    partial upper and lower    Past Surgical History:  Procedure Laterality Date   ABDOMINAL  HYSTERECTOMY     COLONOSCOPY WITH PROPOFOL N/A 06/21/2017   Procedure: COLONOSCOPY WITH PROPOFOL;  Surgeon: Virgel Manifold, MD;  Location: [place];  Service: Endoscopy;  Laterality: N/A;   ESOPHAGOGASTRODUODENOSCOPY (EGD) WITH PROPOFOL N/A 06/21/2017   Procedure: ESOPHAGOGASTRODUODENOSCOPY (EGD) WITH PROPOFOL;  Surgeon: Virgel Manifold, MD;  Location: [place];  Service: Endoscopy;  Laterality: N/A;   TONSILLECTOMY      Family History  Problem Relation Age of Onset   Heart disease Father    Arthritis Father    Other Father        joint replacement   Cancer Mother        lung   Anemia Sister    Other Sister        CHF   Breast cancer Sister 62   Cancer Sister        1 sister died of metastatic cancer   Congestive Heart Failure Sister    Cancer Brother        lung cancer   Coronary artery disease Neg Hx    Diabetes Neg Hx     Social History   Socioeconomic History   Marital status: Married    Spouse name: Not  on file   Number of children: Not on file   Years of education: Not on file   Highest education level: Not on file  Occupational History   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  Tobacco Use   Smoking status: Former    Packs/day: 1.00    Types: Cigarettes    Quit date: 05/13/2021    Years since quitting: 0.6    Passive exposure: Past   Smokeless tobacco: Never   Tobacco comments:    since age 75  Vaping Use   Vaping Use: Never used  Substance and Sexual Activity   Alcohol use: No    Alcohol/week: 0.0 standard drinks of alcohol   Drug use: No   Sexual activity: Not on file  Other Topics Concern   Not on file  Social History Narrative   2 step children      Has living will   Health care POA is husband---sister [name] would be alternate   Would accept resuscitation   No prolonged tube feeds if cognitively unaware   Social Determinants of Health   Financial Resource Strain: Not on file  Food  Insecurity: Not on file  Transportation Needs: Not on file  Physical Activity: Not on file  Stress: Not on file  Social Connections: Not on file  Intimate Partner Violence: Not on file   Review of Systems Appetite is fine Went back to smoking--stress with husband after carotid surgery (didn't do well)     Objective:   Physical Exam Constitutional:      Appearance: Normal appearance.  Neurological:     Mental Status: She is alert.  Psychiatric:        Mood and Affect: Mood normal.        Behavior: Behavior normal.            Assessment & Plan:

## 2022-01-15 ENCOUNTER — Other Ambulatory Visit: Payer: Self-pay | Admitting: Internal Medicine

## 2022-01-27 ENCOUNTER — Other Ambulatory Visit: Payer: Self-pay | Admitting: Internal Medicine

## 2022-01-27 MED ORDER — HYDROCODONE-ACETAMINOPHEN 10-325 MG PO TABS: 1.0000 | ORAL_TABLET | Freq: Three times a day (TID) | ORAL | 0 refills | Status: DC | PRN

## 2022-01-27 NOTE — Telephone Encounter (Signed)
Name of Medication: Hydrocodone Name of Pharmacy: [facility] or Written Date and Quantity: 12/30/21 #75 Last Office Visit and Type: 01/06/22 Next Office Visit and Type: 04/07/22 Last Controlled Substance Agreement Date: 04/14/21 Last UDS:04/14/21

## 2022-01-28 ENCOUNTER — Encounter: Payer: Self-pay | Admitting: Internal Medicine

## 2022-01-28 MED ORDER — HYDROCODONE-ACETAMINOPHEN 10-325 MG PO TABS: 1.0000 | ORAL_TABLET | Freq: Three times a day (TID) | ORAL | 0 refills | Status: DC | PRN

## 2022-01-28 NOTE — Telephone Encounter (Signed)
[facility]

## 2022-01-28 NOTE — Telephone Encounter (Signed)
[facility] and left message to [facility] rx there.

## 2022-01-29 ENCOUNTER — Encounter: Payer: Self-pay | Admitting: Internal Medicine

## 2022-02-04 ENCOUNTER — Other Ambulatory Visit: Payer: Self-pay | Admitting: Internal Medicine

## 2022-02-04 DIAGNOSIS — Z1231 Encounter for screening mammogram for malignant neoplasm of breast: Secondary | ICD-10-CM

## 2022-02-11 ENCOUNTER — Ambulatory Visit
Admission: RE | Admit: 2022-02-11 | Discharge: 2022-02-11 | Disposition: A | Discharge status: Home / Self Care | Payer: Medicare Other | Source: Ambulatory Visit | Attending: Internal Medicine | Admitting: Internal Medicine

## 2022-02-11 DIAGNOSIS — Z1231 Encounter for screening mammogram for malignant neoplasm of breast: Secondary | ICD-10-CM | POA: Diagnosis not present

## 2022-02-24 ENCOUNTER — Other Ambulatory Visit: Payer: Self-pay | Admitting: Internal Medicine

## 2022-02-25 MED ORDER — HYDROCODONE-ACETAMINOPHEN 10-325 MG PO TABS: 1.0000 | ORAL_TABLET | Freq: Three times a day (TID) | ORAL | 0 refills | Status: DC | PRN

## 2022-02-25 NOTE — Telephone Encounter (Signed)
Name of Medication: Hydrocodone 10-325 Name of Pharmacy: [facility] or Written Date and Quantity: 01/28/22 #75 Last Office Visit and Type: 01/06/22 Next Office Visit and Type: 04/07/22 Last Controlled Substance Agreement Date: 04/14/21 Last UDS:04/14/21

## 2022-03-21 ENCOUNTER — Other Ambulatory Visit: Payer: Self-pay | Admitting: Internal Medicine

## 2022-03-24 ENCOUNTER — Other Ambulatory Visit: Payer: Self-pay | Admitting: Internal Medicine

## 2022-03-26 ENCOUNTER — Other Ambulatory Visit: Payer: Self-pay | Admitting: Internal Medicine

## 2022-03-26 MED ORDER — HYDROCODONE-ACETAMINOPHEN 10-325 MG PO TABS: 1.0000 | ORAL_TABLET | Freq: Three times a day (TID) | ORAL | 0 refills | Status: DC | PRN

## 2022-03-26 NOTE — Telephone Encounter (Signed)
Refill request for HYDROcodone-acetaminophen (NORCO) 10-325 MG tablet   LOV - 01/06/22 Next OV - 04/07/22 Last refill - 02/25/22 #75/0

## 2022-03-26 NOTE — Telephone Encounter (Signed)
Duplicate request that EMR will not let us refuse. Please refuse refill.  

## 2022-03-26 NOTE — Telephone Encounter (Signed)
Duplicate request that EMR will not let us refuse. Please refuse refill.

## 2022-04-07 ENCOUNTER — Encounter: Payer: Self-pay | Admitting: Internal Medicine

## 2022-04-07 ENCOUNTER — Telehealth (INDEPENDENT_AMBULATORY_CARE_PROVIDER_SITE_OTHER): Payer: Medicare Other | Admitting: Internal Medicine

## 2022-04-07 VITALS — Ht 62.0 in | Wt 125.0 lb

## 2022-04-07 DIAGNOSIS — M549 Dorsalgia, unspecified: Secondary | ICD-10-CM

## 2022-04-07 DIAGNOSIS — J22 Unspecified acute lower respiratory infection: Secondary | ICD-10-CM | POA: Diagnosis not present

## 2022-04-07 DIAGNOSIS — F1120 Opioid dependence, uncomplicated: Secondary | ICD-10-CM | POA: Diagnosis not present

## 2022-04-07 DIAGNOSIS — J440 Chronic obstructive pulmonary disease with acute lower respiratory infection: Secondary | ICD-10-CM

## 2022-04-07 DIAGNOSIS — G8929 Other chronic pain: Secondary | ICD-10-CM

## 2022-04-07 MED ORDER — ALBUTEROL SULFATE HFA 108 (90 BASE) MCG/ACT IN AERS: 2.0000 | INHALATION_SPRAY | Freq: Four times a day (QID) | RESPIRATORY_TRACT | 1 refills | Status: DC | PRN

## 2022-04-07 NOTE — Assessment & Plan Note (Signed)
PDMP reviewed No concerns 

## 2022-04-07 NOTE — Assessment & Plan Note (Signed)
Could be COVID--but she doesn't want paxlovid even if it is (so no testing) If develops productive cough, would try empiric doxy 100 bid

## 2022-04-07 NOTE — Assessment & Plan Note (Signed)
Does okay with the hydrocodone up to three times a day--and the cyclobenzaprine

## 2022-04-07 NOTE — Progress Notes (Signed)
Subjective:    Patient ID: Catherine Griffin, female    DOB: 07-24-1955, 66 y.o.   MRN: [id number]  HPI Video virtual visit for follow up of chronic back pain and narcotic dependence  She has a respiratory illness as well Identification done Reviewed limitations and billing and she gave consent Participants--patient in her home and I am in my office  Back pain is about the same Did pull it with cough Same hydrocodone and does okay  Has had a "cold" for 2 days Runny nose, bad headache No fever---but feels cold/hot Some aching in neck/shoulders No SOB---other than her usual (she did start smoking again)  Cough is dry Slight wheezing   Current Outpatient Medications on File Prior to Visit  Medication Sig Dispense Refill   aspirin 81 MG tablet Take 81 mg by mouth daily.     cholecalciferol (VITAMIN D) 1000 UNITS tablet Take 1,000 Units by mouth daily.     cyclobenzaprine (FLEXERIL) 10 MG tablet TAKE 1/2-1 TABLET (5-10 MG TOTAL) BY MOUTH AT BEDTIME AS NEEDED. 30 tablet 1   HYDROcodone-acetaminophen (NORCO) 10-325 MG tablet Take 1 tablet by mouth 3 (three) times daily as needed. 75 tablet 0   levothyroxine (SYNTHROID) 50 MCG tablet TAKE 1 TABLET BY MOUTH EVERY DAY BEFORE BREAKFAST 90 tablet 3   No current facility-administered medications on file prior to visit.    Allergies  Allergen Reactions   Codeine Phosphate [Codeine] Nausea Only   Indomethacin     REACTION: Rash/hives/swelling    Past Medical History:  Diagnosis Date   Abdominal pain    Abdominal pain, right upper quadrant    Actinic keratosis    04/25/07   Aphthous ulcer of mouth    Arthritis    Back pain    04/25/07   Chest pain    04/25/07   Diverticulosis of colon    02/28/09   Fatigue    05/30/08   Hemorrhoid    12/01/10   Hepatitis C    Hyperlipidemia    Hypothyroidism    Hypothyroidism    Lichen sclerosus    Osteopenia    Osteoporosis    04/25/07   RUQ abdominal pain    02/07/09   Spinal  stenosis    Thyroid disease    hypothyrodism   Vaginitis    10/01/10   Wears dentures    partial upper and lower    Past Surgical History:  Procedure Laterality Date   ABDOMINAL HYSTERECTOMY     COLONOSCOPY WITH PROPOFOL N/A 06/21/2017   Procedure: COLONOSCOPY WITH PROPOFOL;  Surgeon: Pasty Spillers, MD;  Location: [facility];  Service: Endoscopy;  Laterality: N/A;   ESOPHAGOGASTRODUODENOSCOPY (EGD) WITH PROPOFOL N/A 06/21/2017   Procedure: ESOPHAGOGASTRODUODENOSCOPY (EGD) WITH PROPOFOL;  Surgeon: Pasty Spillers, MD;  Location: [facility];  Service: Endoscopy;  Laterality: N/A;   TONSILLECTOMY      Family History  Problem Relation Age of Onset   Heart disease Father    Arthritis Father    Other Father        joint replacement   Cancer Mother        lung   Anemia Sister    Other Sister        CHF   Breast cancer Sister 62   Cancer Sister        1 sister died of metastatic cancer   Congestive Heart Failure Sister    Cancer Brother  lung cancer   Coronary artery disease Neg Hx    Diabetes Neg Hx     Social History   Socioeconomic History   Marital status: Married    Spouse name: Not on file   Number of children: Not on file   Years of education: Not on file   Highest education level: Not on file  Occupational History   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  Tobacco Use   Smoking status: Every Day    Packs/day: 1.00    Types: Cigarettes    Last attempt to quit: 05/13/2021    Years since quitting: 0.9    Passive exposure: Past   Smokeless tobacco: Never   Tobacco comments:    since age 36  Vaping Use   Vaping Use: Never used  Substance and Sexual Activity   Alcohol use: No    Alcohol/week: 0.0 standard drinks of alcohol   Drug use: No   Sexual activity: Not on file  Other Topics Concern   Not on file  Social History Narrative   2 step children      Has living will   Health care POA is  husband---sister [name] would be alternate   Would accept resuscitation   No prolonged tube feeds if cognitively unaware   Social Determinants of Health   Financial Resource Strain: Not on file  Food Insecurity: Not on file  Transportation Needs: Not on file  Physical Activity: Not on file  Stress: Not on file  Social Connections: Not on file  Intimate Partner Violence: Not on file   Review of Systems No change in smell or taste Slight nausea--no vomiting Slight loose stools--first thing in the morning Appetite is off--eating a little     Objective:   Physical Exam Constitutional:      Appearance: Normal appearance.  Pulmonary:     Effort: Pulmonary effort is normal. No respiratory distress.  Neurological:     Mental Status: She is alert.            Assessment & Plan:

## 2022-04-07 NOTE — Assessment & Plan Note (Signed)
Mild wheezing with illness Unfortunately has restarted smoking---urged her to stop again Will Rx albuterol inhaler for prn use

## 2022-04-14 ENCOUNTER — Telehealth: Payer: Self-pay | Admitting: Internal Medicine

## 2022-04-14 NOTE — Telephone Encounter (Signed)
Called patient, left voicemail to callback and schedule appointment.   Venia Carbon, MD  [facility] Support Pool Please set up AMW in April

## 2022-04-19 ENCOUNTER — Other Ambulatory Visit: Payer: Self-pay | Admitting: Internal Medicine

## 2022-04-20 ENCOUNTER — Other Ambulatory Visit: Payer: Self-pay | Admitting: Internal Medicine

## 2022-04-21 MED ORDER — HYDROCODONE-ACETAMINOPHEN 10-325 MG PO TABS: 1.0000 | ORAL_TABLET | Freq: Three times a day (TID) | ORAL | 0 refills | Status: DC | PRN

## 2022-04-21 NOTE — Telephone Encounter (Signed)
Name of Medication: Hydrocodone 10-325 Name of [facility]acy: [facility] or Written Date and Quantity: 03/26/22 #75 Last Office Visit and Type: 04/07/22 Next Office Visit and Type: 07/15/22 Last Controlled Substance Agreement Date: 04/14/21 Last UDS:04/14/21

## 2022-05-14 DIAGNOSIS — H353132 Nonexudative age-related macular degeneration, bilateral, intermediate dry stage: Secondary | ICD-10-CM | POA: Diagnosis not present

## 2022-05-16 ENCOUNTER — Other Ambulatory Visit: Payer: Self-pay | Admitting: Internal Medicine

## 2022-05-17 MED ORDER — HYDROCODONE-ACETAMINOPHEN 10-325 MG PO TABS: 1.0000 | ORAL_TABLET | Freq: Three times a day (TID) | ORAL | 0 refills | Status: DC | PRN

## 2022-05-17 NOTE — Telephone Encounter (Signed)
Name of Medication: Hydrocodone 10-325 Name of Pharmacy: [facility] or Written Date and Quantity: 04/21/22 #75 Last Office Visit and Type: 04/07/22 Next Office Visit and Type: 07/15/22 Last Controlled Substance Agreement Date: 04/14/21 Last UDS:04/14/21

## 2022-05-18 ENCOUNTER — Other Ambulatory Visit: Payer: Self-pay | Admitting: Internal Medicine

## 2022-06-13 ENCOUNTER — Other Ambulatory Visit: Payer: Self-pay | Admitting: Internal Medicine

## 2022-06-13 DIAGNOSIS — G8929 Other chronic pain: Secondary | ICD-10-CM

## 2022-06-13 DIAGNOSIS — F1120 Opioid dependence, uncomplicated: Secondary | ICD-10-CM

## 2022-06-14 MED ORDER — HYDROCODONE-ACETAMINOPHEN 10-325 MG PO TABS: 1.0000 | ORAL_TABLET | Freq: Three times a day (TID) | ORAL | 0 refills | Status: DC | PRN

## 2022-06-14 NOTE — Telephone Encounter (Signed)
Name of Medication: Hydrocodone 10-325 Name of Pharmacy: [facility] or Written Date and Quantity: 05/17/22 #75 Last Office Visit and Type: 04/07/22 Next Office Visit and Type: 07/15/22 Last Controlled Substance Agreement Date: 04/14/21 Last UDS:04/14/21  Pharmacy is back online. Can accept electronic controlled substances.

## 2022-06-14 NOTE — Telephone Encounter (Signed)
Won't go electronic--tried twice Make other arrangements tomorrow please

## 2022-06-15 NOTE — Telephone Encounter (Signed)
Patient called, Catherine Griffin did not get her prescription Does it need to be printed for the patient? Please advise

## 2022-06-16 ENCOUNTER — Encounter: Payer: Self-pay | Admitting: Internal Medicine

## 2022-06-17 ENCOUNTER — Other Ambulatory Visit: Payer: Self-pay | Admitting: Nurse Practitioner

## 2022-06-17 DIAGNOSIS — G8929 Other chronic pain: Secondary | ICD-10-CM

## 2022-06-17 DIAGNOSIS — F1120 Opioid dependence, uncomplicated: Secondary | ICD-10-CM

## 2022-06-17 MED ORDER — HYDROCODONE-ACETAMINOPHEN 10-325 MG PO TABS: 1.0000 | ORAL_TABLET | Freq: Three times a day (TID) | ORAL | 0 refills | Status: DC | PRN

## 2022-06-17 NOTE — Telephone Encounter (Signed)
Medication has been taken care of. 

## 2022-06-17 NOTE — Telephone Encounter (Signed)
Called and spoke to pt. Looks like we accidentally sent the [facility] to [facility]. I called and spoke to the pt. [facility] will [facility] there.

## 2022-06-17 NOTE — Addendum Note (Signed)
Addended by: Pilar Grammes on: 06/17/2022 08:21 AM   Modules accepted: Orders

## 2022-06-21 ENCOUNTER — Other Ambulatory Visit: Payer: Self-pay

## 2022-06-21 DIAGNOSIS — G8929 Other chronic pain: Secondary | ICD-10-CM

## 2022-06-21 DIAGNOSIS — F1120 Opioid dependence, uncomplicated: Secondary | ICD-10-CM

## 2022-06-21 MED ORDER — HYDROCODONE-ACETAMINOPHEN 10-325 MG PO TABS: 1.0000 | ORAL_TABLET | Freq: Three times a day (TID) | ORAL | 0 refills | Status: DC | PRN

## 2022-06-21 NOTE — Telephone Encounter (Signed)
Carolyne Fiscal Kalan  You9 hours ago (12:27 AM)    Roseanna Rainbow, It's me again still having problems with this hydrocodone prescription. [facility] by [facility] but [facility] not [facility] in [facility]. They are now telling me it may be Wednesday at 6pm before I can get them filled. I hate to put you thru this mess again but could you PLEASE cancel [facility] and send to [facility] [place]. When I talked to them on Friday they did have them in stock....so hopefully this will be the end of this mess. I am so sorry this has been such as hassle with one problem after another. [facility] and [facility] new one to [facility].  Hope you have a better work week than what went on last week....been really crazy all the way around. I will be at home until 10:30 am and can be reached at [phone].... after 10:30 am please call or text my mobile....[phone].  AGAIN I AM SO SORRY.

## 2022-07-15 ENCOUNTER — Ambulatory Visit (INDEPENDENT_AMBULATORY_CARE_PROVIDER_SITE_OTHER): Payer: Medicare Other | Admitting: Internal Medicine

## 2022-07-15 ENCOUNTER — Encounter: Payer: Self-pay | Admitting: Internal Medicine

## 2022-07-15 ENCOUNTER — Encounter: Payer: Medicare Other | Admitting: Internal Medicine

## 2022-07-15 ENCOUNTER — Other Ambulatory Visit: Payer: Self-pay | Admitting: Internal Medicine

## 2022-07-15 VITALS — BP 120/80 | HR 90 | Temp 97.3°F | Ht 62.0 in | Wt 130.0 lb

## 2022-07-15 DIAGNOSIS — J449 Chronic obstructive pulmonary disease, unspecified: Secondary | ICD-10-CM | POA: Diagnosis not present

## 2022-07-15 DIAGNOSIS — Z0000 Encounter for general adult medical examination without abnormal findings: Secondary | ICD-10-CM

## 2022-07-15 DIAGNOSIS — E785 Hyperlipidemia, unspecified: Secondary | ICD-10-CM

## 2022-07-15 DIAGNOSIS — F17200 Nicotine dependence, unspecified, uncomplicated: Secondary | ICD-10-CM

## 2022-07-15 DIAGNOSIS — F39 Unspecified mood [affective] disorder: Secondary | ICD-10-CM | POA: Diagnosis not present

## 2022-07-15 DIAGNOSIS — E039 Hypothyroidism, unspecified: Secondary | ICD-10-CM | POA: Diagnosis not present

## 2022-07-15 DIAGNOSIS — M549 Dorsalgia, unspecified: Secondary | ICD-10-CM | POA: Diagnosis not present

## 2022-07-15 DIAGNOSIS — F1120 Opioid dependence, uncomplicated: Secondary | ICD-10-CM

## 2022-07-15 DIAGNOSIS — G8929 Other chronic pain: Secondary | ICD-10-CM

## 2022-07-15 LAB — COMPREHENSIVE METABOLIC PANEL
ALT: 7 U/L (ref 0–35)
AST: 18 U/L (ref 0–37)
Albumin: 4.1 g/dL (ref 3.5–5.2)
Alkaline Phosphatase: 68 U/L (ref 39–117)
BUN: 19 mg/dL (ref 6–23)
CO2: 28 mEq/L (ref 19–32)
Calcium: 9.2 mg/dL (ref 8.4–10.5)
Chloride: 103 mEq/L (ref 96–112)
Creatinine, Ser: 0.71 mg/dL (ref 0.40–1.20)
GFR: 87.05 mL/min (ref >60.00)
Glucose, Bld: 84 mg/dL (ref 70–99)
Potassium: 4.2 mEq/L (ref 3.5–5.1)
Sodium: 138 mEq/L (ref 135–145)
Total Bilirubin: 0.2 mg/dL (ref 0.2–1.2)
Total Protein: 7 g/dL (ref 6.0–8.3)

## 2022-07-15 LAB — LIPID PANEL
Cholesterol: 234 mg/dL — ABNORMAL HIGH (ref 0–200)
HDL: 48.7 mg/dL (ref >39.00)
LDL Cholesterol: 158 mg/dL — ABNORMAL HIGH (ref 0–99)
NonHDL: 185.63
Total CHOL/HDL Ratio: 5
Triglycerides: 136 mg/dL (ref 0.0–149.0)
VLDL: 27.2 mg/dL (ref 0.0–40.0)

## 2022-07-15 LAB — CBC
HCT: 41.4 % (ref 36.0–46.0)
Hemoglobin: 14.2 g/dL (ref 12.0–15.0)
MCHC: 34.3 g/dL (ref 30.0–36.0)
MCV: 94.1 fl (ref 78.0–100.0)
Platelets: 262 10*3/uL (ref 150.0–400.0)
RBC: 4.4 Mil/uL (ref 3.87–5.11)
RDW: 14.9 % (ref 11.5–15.5)
WBC: 7.2 10*3/uL (ref 4.0–10.5)

## 2022-07-15 LAB — TSH: TSH: 1.69 u[IU]/mL (ref 0.35–5.50)

## 2022-07-15 LAB — T4, FREE: Free T4: 0.75 ng/dL (ref 0.60–1.60)

## 2022-07-15 MED ORDER — ALBUTEROL SULFATE HFA 108 (90 BASE) MCG/ACT IN AERS: 2.0000 | INHALATION_SPRAY | Freq: Four times a day (QID) | RESPIRATORY_TRACT | 2 refills | Status: AC | PRN

## 2022-07-15 NOTE — Progress Notes (Signed)
Subjective:    Patient ID: Catherine Griffin, female    DOB: 04-08-1956, 67 y.o.   MRN: [id number]  HPI Here for Medicare wellness visit and follow up of chronic health conditions Reviewed advanced directives Reviewed other doctors---[facility], [facility], Dr Mitchum--dentist No hospitalizations or surgery in the past year Vision is okay--but diagnosed with AMD (now on AREDS) Hearing is off some--not enough for action No alcohol No exercise--but is physically active at work No falls Independent with instrumental ADLs Mild recall issues with memory--but no functional changes  Mild allergy symptoms---benedryl helps  Discussed newer antihistamines  Back to smoking Discussed trying to quit again Chronic cough especially at night---out of albuterol  No SOB Does wheeze Really seems to be affecting sleep  Gets anxiety at times No depression and not anhedonic Stress with husband's illness and condition  Back is about the same Pool season starting--one large contract Uses the hydrocodone 2-3 times a day  Current Outpatient Medications on File Prior to Visit  Medication Sig Dispense Refill   albuterol (VENTOLIN HFA) 108 (90 Base) MCG/ACT inhaler Inhale 2 puffs into the lungs every 6 (six) hours as needed for wheezing or shortness of breath. 8 g 1   aspirin 81 MG tablet Take 81 mg by mouth daily.     cholecalciferol (VITAMIN D) 1000 UNITS tablet Take 1,000 Units by mouth daily.     cyclobenzaprine (FLEXERIL) 10 MG tablet TAKE 1/2-1 TABLET (5-10 MG TOTAL) BY MOUTH AT BEDTIME AS NEEDED. 30 tablet 1   HYDROcodone-acetaminophen (NORCO) 10-325 MG tablet Take 1 tablet by mouth 3 (three) times daily as needed. 75 tablet 0   levothyroxine (SYNTHROID) 50 MCG tablet TAKE 1 TABLET BY MOUTH EVERY DAY BEFORE BREAKFAST 90 tablet 3   No current facility-administered medications on file prior to visit.    Allergies  Allergen Reactions   Codeine Phosphate [Codeine] Nausea Only    Indomethacin     REACTION: Rash/hives/swelling    Past Medical History:  Diagnosis Date   Abdominal pain    Abdominal pain, right upper quadrant    Actinic keratosis    04/25/07   Aphthous ulcer of mouth    Arthritis    Back pain    04/25/07   Chest pain    04/25/07   Diverticulosis of colon    02/28/09   Dry age-related macular degeneration    Fatigue    05/30/08   Hemorrhoid    12/01/10   Hepatitis C    Hyperlipidemia    Hypothyroidism    Hypothyroidism    Lichen sclerosus    Osteopenia    Osteoporosis    04/25/07   RUQ abdominal pain    02/07/09   Spinal stenosis    Thyroid disease    hypothyrodism   Vaginitis    10/01/10   Wears dentures    partial upper and lower    Past Surgical History:  Procedure Laterality Date   ABDOMINAL HYSTERECTOMY     COLONOSCOPY WITH PROPOFOL N/A 06/21/2017   Procedure: COLONOSCOPY WITH PROPOFOL;  Surgeon: Virgel Manifold, MD;  Location: [facility];  Service: Endoscopy;  Laterality: N/A;   ESOPHAGOGASTRODUODENOSCOPY (EGD) WITH PROPOFOL N/A 06/21/2017   Procedure: ESOPHAGOGASTRODUODENOSCOPY (EGD) WITH PROPOFOL;  Surgeon: Virgel Manifold, MD;  Location: [place];  Service: Endoscopy;  Laterality: N/A;   TONSILLECTOMY      Family History  Problem Relation Age of Onset   Heart disease Father    Arthritis Father  Other Father        joint replacement   Cancer Mother        lung   Anemia Sister    Other Sister        CHF   Breast cancer Sister 57   Cancer Sister        1 sister died of metastatic cancer   Congestive Heart Failure Sister    Cancer Brother        lung cancer   Coronary artery disease Neg Hx    Diabetes Neg Hx     Social History   Socioeconomic History   Marital status: Married    Spouse name: Not on file   Number of children: Not on file   Years of education: Not on file   Highest education level: Not on file  Occupational History   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  Tobacco Use   Smoking status: Every Day    Packs/day: 1    Types: Cigarettes    Last attempt to quit: 05/13/2021    Years since quitting: 1.1    Passive exposure: Past   Smokeless tobacco: Never   Tobacco comments:    since age 26  Vaping Use   Vaping Use: Never used  Substance and Sexual Activity   Alcohol use: No    Alcohol/week: 0.0 standard drinks of alcohol   Drug use: No   Sexual activity: Not on file  Other Topics Concern   Not on file  Social History Narrative   2 step children      Has living will   Health care POA is husband---sister [name] would be alternate   Would accept resuscitation   No prolonged tube feeds if cognitively unaware   Social Determinants of Health   Financial Resource Strain: Not on file  Food Insecurity: Not on file  Transportation Needs: Not on file  Physical Activity: Not on file  Stress: Not on file  Social Connections: Not on file  Intimate Partner Violence: Not on file   Review of Systems Appetite is okay Weight stable Not a good sleeper--up often. Gets hot at times Wears seat belt Teeth are okay--keeps up with dentist No suspicious skin lesions No heartburn or dysphagia Bowels move fine---no blood. Uses stool softener Voids okay    Objective:   Physical Exam Constitutional:      Appearance: Normal appearance.  HENT:     Mouth/Throat:     Pharynx: No oropharyngeal exudate or posterior oropharyngeal erythema.  Eyes:     Conjunctiva/sclera: Conjunctivae normal.     Pupils: Pupils are equal, round, and reactive to light.  Cardiovascular:     Rate and Rhythm: Normal rate and regular rhythm.     Pulses: Normal pulses.     Heart sounds: No murmur heard.    No gallop.  Pulmonary:     Effort: Pulmonary effort is normal.     Breath sounds: Normal breath sounds. No wheezing or rales.  Abdominal:     Palpations: Abdomen is soft.     Tenderness: There is no abdominal tenderness.  Musculoskeletal:      Cervical back: Neck supple.     Right lower leg: No edema.     Left lower leg: No edema.  Lymphadenopathy:     Cervical: No cervical adenopathy.  Skin:    Findings: No rash.  Neurological:     General: No focal deficit present.  Mental Status: She is alert and oriented to person, place, and time.     Comments: Word naming 11/1 minute Recall 2/3  Psychiatric:        Mood and Affect: Mood normal.        Behavior: Behavior normal.            Assessment & Plan:

## 2022-07-15 NOTE — Assessment & Plan Note (Signed)
I have personally reviewed the Medicare Annual Wellness questionnaire and have noted 1. The patient's medical and social history 2. Their use of alcohol, tobacco or illicit drugs 3. Their current medications and supplements 4. The patient's functional ability including ADL's, fall risks, home safety risks and hearing or visual             impairment. 5. Diet and physical activities 6. Evidence for depression or mood disorders  The patients weight, height, BMI and visual acuity have been recorded in the chart I have made referrals, counseling and provided education to the patient based review of the above and I have provided the pt with a written personalized care plan for preventive services.  I have provided you with a copy of your personalized plan for preventive services. Please take the time to review along with your updated medication list.  Colon due again 2029 Mammogram every 2 years till at least 73--next 11/25 No pap due to age 34 active Doesn't want shingrix or COVID vaccines Flu in the fall--consider RSV Td at the pharmacy

## 2022-07-15 NOTE — Assessment & Plan Note (Signed)
Remains functional with the hydrocodone 10/325---up to 3 daily

## 2022-07-15 NOTE — Assessment & Plan Note (Signed)
Having more symptoms I am concerned about nocturnal hypoxia with her symptoms Will refer for pulmonary evaluation and lung cancer screening

## 2022-07-15 NOTE — Assessment & Plan Note (Signed)
Seems to be euthyroid on levothyroxine 50 °

## 2022-07-15 NOTE — Assessment & Plan Note (Signed)
Will recheck again Not planning primary prevention

## 2022-07-15 NOTE — Assessment & Plan Note (Signed)
Reactive anxiety and dysthymia Mostly stress with husband's health Meds not indicated

## 2022-07-15 NOTE — Assessment & Plan Note (Signed)
PDMP reviewed No concerns UDS today

## 2022-07-16 ENCOUNTER — Encounter: Payer: Medicare Other | Admitting: Internal Medicine

## 2022-07-17 LAB — DRUG MONITORING, PANEL 8 WITH CONFIRMATION, URINE
6 Acetylmorphine: NEGATIVE ng/mL (ref <10)
Alcohol Metabolites: NEGATIVE ng/mL (ref <500)
Amphetamines: NEGATIVE ng/mL (ref <500)
Benzodiazepines: NEGATIVE ng/mL (ref <100)
Buprenorphine, Urine: NEGATIVE ng/mL (ref <5)
Cocaine Metabolite: NEGATIVE ng/mL (ref <150)
Codeine: NEGATIVE ng/mL (ref <50)
Creatinine: 33 mg/dL (ref >=20.0)
Hydrocodone: 614 ng/mL — ABNORMAL HIGH (ref <50)
Hydromorphone: 684 ng/mL — ABNORMAL HIGH (ref <50)
MDMA: NEGATIVE ng/mL (ref <500)
Marijuana Metabolite: NEGATIVE ng/mL (ref <20)
Morphine: NEGATIVE ng/mL (ref <50)
Norhydrocodone: 1468 ng/mL — ABNORMAL HIGH (ref <50)
Opiates: POSITIVE ng/mL — AB (ref <100)
Oxidant: NEGATIVE ug/mL (ref <200)
Oxycodone: NEGATIVE ng/mL (ref <100)
pH: 5.3 (ref 4.5–9.0)

## 2022-07-17 LAB — DM TEMPLATE

## 2022-08-07 ENCOUNTER — Other Ambulatory Visit: Payer: Self-pay | Admitting: Internal Medicine

## 2022-08-07 DIAGNOSIS — F1120 Opioid dependence, uncomplicated: Secondary | ICD-10-CM

## 2022-08-07 DIAGNOSIS — G8929 Other chronic pain: Secondary | ICD-10-CM

## 2022-08-09 MED ORDER — HYDROCODONE-ACETAMINOPHEN 10-325 MG PO TABS
1.0000 | ORAL_TABLET | Freq: Three times a day (TID) | ORAL | 0 refills | Status: DC | PRN
Start: 2022-08-09 — End: 2022-09-02

## 2022-08-09 NOTE — Telephone Encounter (Signed)
Name of Medication: Hydrocodone 10-325 Name of Pharmacy: [facility] or Written Date and Quantity: 06-21-22 #75 Last Office Visit and Type: 07-15-22 Next Office Visit and Type: 10-22-22 Last Controlled Substance Agreement Date: 07-15-22 Last UDS:07-15-22

## 2022-09-02 ENCOUNTER — Other Ambulatory Visit: Payer: Self-pay | Admitting: Internal Medicine

## 2022-09-02 DIAGNOSIS — F1120 Opioid dependence, uncomplicated: Secondary | ICD-10-CM

## 2022-09-02 DIAGNOSIS — G8929 Other chronic pain: Secondary | ICD-10-CM

## 2022-09-02 MED ORDER — HYDROCODONE-ACETAMINOPHEN 10-325 MG PO TABS
1.0000 | ORAL_TABLET | Freq: Three times a day (TID) | ORAL | 0 refills | Status: DC | PRN
Start: 2022-09-02 — End: 2022-09-25

## 2022-09-02 NOTE — Telephone Encounter (Signed)
Name of Medication: Hydrocodone 10-325 Name of Pharmacy: [facility] or Written Date and Quantity: 08-09-22 #75 Last Office Visit and Type: 07-15-22 Next Office Visit and Type: 10-22-22 Last Controlled Substance Agreement Date: 07-15-22 Last UDS:07-15-22

## 2022-09-10 ENCOUNTER — Other Ambulatory Visit: Payer: Self-pay | Admitting: Internal Medicine

## 2022-09-25 ENCOUNTER — Other Ambulatory Visit: Payer: Self-pay | Admitting: Internal Medicine

## 2022-09-25 DIAGNOSIS — G8929 Other chronic pain: Secondary | ICD-10-CM

## 2022-09-25 DIAGNOSIS — F1120 Opioid dependence, uncomplicated: Secondary | ICD-10-CM

## 2022-09-27 MED ORDER — HYDROCODONE-ACETAMINOPHEN 10-325 MG PO TABS
1.0000 | ORAL_TABLET | Freq: Three times a day (TID) | ORAL | 0 refills | Status: DC | PRN
Start: 2022-09-27 — End: 2022-10-22

## 2022-10-12 ENCOUNTER — Encounter: Payer: Self-pay | Admitting: Internal Medicine

## 2022-10-20 ENCOUNTER — Ambulatory Visit: Payer: BLUE CROSS/BLUE SHIELD | Admitting: Internal Medicine

## 2022-10-21 ENCOUNTER — Other Ambulatory Visit: Payer: Self-pay | Admitting: Internal Medicine

## 2022-10-21 DIAGNOSIS — G8929 Other chronic pain: Secondary | ICD-10-CM

## 2022-10-21 DIAGNOSIS — F1120 Opioid dependence, uncomplicated: Secondary | ICD-10-CM

## 2022-10-22 ENCOUNTER — Encounter: Payer: Self-pay | Admitting: Internal Medicine

## 2022-10-22 ENCOUNTER — Telehealth (INDEPENDENT_AMBULATORY_CARE_PROVIDER_SITE_OTHER): Payer: Medicare Other | Admitting: Internal Medicine

## 2022-10-22 DIAGNOSIS — M545 Low back pain, unspecified: Secondary | ICD-10-CM | POA: Diagnosis not present

## 2022-10-22 DIAGNOSIS — G8929 Other chronic pain: Secondary | ICD-10-CM | POA: Diagnosis not present

## 2022-10-22 DIAGNOSIS — F1120 Opioid dependence, uncomplicated: Secondary | ICD-10-CM | POA: Diagnosis not present

## 2022-10-22 DIAGNOSIS — M549 Dorsalgia, unspecified: Secondary | ICD-10-CM

## 2022-10-22 MED ORDER — HYDROCODONE-ACETAMINOPHEN 10-325 MG PO TABS
1.0000 | ORAL_TABLET | Freq: Three times a day (TID) | ORAL | 0 refills | Status: DC | PRN
Start: 2022-10-22 — End: 2022-11-15

## 2022-10-22 NOTE — Telephone Encounter (Signed)
Name of Medication: Hydrocodone 10-325 Name of Pharmacy: [facility] Written Date and Quantity: 09-27-22 #75 Last Office Visit and Type: 07-15-22 Next Office Visit and Type: 10-22-22 (Today) Last Controlled Substance Agreement Date: 07-15-22 Last UDS:07-15-22

## 2022-10-22 NOTE — Assessment & Plan Note (Signed)
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

## 2022-10-22 NOTE — Progress Notes (Signed)
Subjective:    Patient ID: Catherine Griffin, female    DOB: 1955-10-29, 67 y.o.   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 8 g 2   aspirin 81 MG tablet Take 81 mg by mouth daily.     cholecalciferol (VITAMIN D) 1000 UNITS tablet Take 1,000 Units by mouth daily.     cyclobenzaprine (FLEXERIL) 10 MG tablet TAKE 1/2-1 TABLET (5-10 MG TOTAL) BY MOUTH AT BEDTIME AS NEEDED. 30 tablet 1   HYDROcodone-acetaminophen (NORCO) 10-325 MG tablet Take 1 tablet by mouth 3 (three) times daily as needed. 75 tablet 0   levothyroxine (SYNTHROID) 50 MCG tablet TAKE 1 TABLET BY MOUTH EVERY DAY BEFORE BREAKFAST 90 tablet 3   No current facility-administered medications on file prior to visit.    Allergies  Allergen Reactions   Codeine Phosphate [Codeine] Nausea Only   Indomethacin     REACTION: Rash/hives/swelling    Past Medical History:  Diagnosis Date   Abdominal pain    Abdominal pain, right upper quadrant    Actinic keratosis    04/25/07   Aphthous ulcer of mouth    Arthritis    Back pain    04/25/07   Chest pain    04/25/07   Diverticulosis of colon    02/28/09   Dry age-related macular degeneration    Fatigue    05/30/08   Hemorrhoid    12/01/10   Hepatitis C    Hyperlipidemia    Hypothyroidism    Hypothyroidism    Lichen sclerosus    Osteopenia    Osteoporosis     04/25/07   RUQ abdominal pain    02/07/09   Spinal stenosis    Thyroid disease    hypothyrodism   Vaginitis    10/01/10   Wears dentures    partial upper and lower    Past Surgical History:  Procedure Laterality Date   ABDOMINAL HYSTERECTOMY     COLONOSCOPY WITH PROPOFOL N/A 06/21/2017   Procedure: COLONOSCOPY WITH PROPOFOL;  Surgeon: Pasty Spillers, MD;  Location: [facility];  Service: Endoscopy;  Laterality: N/A;   ESOPHAGOGASTRODUODENOSCOPY (EGD) WITH PROPOFOL N/A 06/21/2017   Procedure: ESOPHAGOGASTRODUODENOSCOPY (EGD) WITH PROPOFOL;  Surgeon: Pasty Spillers, MD;  Location: [facility];  Service: Endoscopy;  Laterality: N/A;   TONSILLECTOMY      Family History  Problem Relation Age of Onset   Heart disease Father    Arthritis Father    Other Father        joint replacement   Cancer Mother        lung   Anemia Sister    Other Sister        CHF   Breast cancer Sister 78   Cancer Sister        1 sister died of metastatic cancer   Congestive Heart Failure Sister  Cancer Brother        lung cancer   Coronary artery disease Neg Hx    Diabetes Neg Hx     Social History   Socioeconomic History   Marital status: Married    Spouse name: Not on file   Number of children: Not on file   Years of education: Not on file   Highest education level: Not on file  Occupational History   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  Tobacco Use   Smoking status: Every Day    Current packs/day: 0.00    Types: Cigarettes    Last attempt to quit: 05/13/2021    Years since quitting: 1.4    Passive exposure: Past   Smokeless tobacco: Never   Tobacco comments:    since age 34  Vaping Use   Vaping status: Never Used  Substance and Sexual Activity   Alcohol use: No    Alcohol/week: 0.0 standard drinks of alcohol   Drug use: No   Sexual activity: Not on file  Other Topics Concern   Not on file  Social History Narrative   2  step children      Has living will   Health care POA is husband---sister [name] would be alternate   Would accept resuscitation   No prolonged tube feeds if cognitively unaware   Social Determinants of Health   Financial Resource Strain: Not on file  Food Insecurity: Not on file  Transportation Needs: Not on file  Physical Activity: Not on file  Stress: Not on file  Social Connections: Not on file  Intimate Partner Violence: Not on file   Review of Systems Sleep is variable---not great at times but some better than in the past Bowels slow at times--uses the stool softener as needed    Objective:   Physical Exam Constitutional:      Appearance: Normal appearance.  Neurological:     Mental Status: She is alert.  Psychiatric:        Mood and Affect: Mood normal.        Behavior: Behavior normal.            Assessment & Plan:

## 2022-10-22 NOTE — Assessment & Plan Note (Signed)
PDMP reviewed No concerns 

## 2022-11-05 ENCOUNTER — Other Ambulatory Visit: Payer: Self-pay | Admitting: Internal Medicine

## 2022-11-15 ENCOUNTER — Other Ambulatory Visit: Payer: Self-pay | Admitting: Internal Medicine

## 2022-11-15 DIAGNOSIS — G8929 Other chronic pain: Secondary | ICD-10-CM

## 2022-11-15 DIAGNOSIS — F1120 Opioid dependence, uncomplicated: Secondary | ICD-10-CM

## 2022-11-15 MED ORDER — HYDROCODONE-ACETAMINOPHEN 10-325 MG PO TABS
1.0000 | ORAL_TABLET | Freq: Three times a day (TID) | ORAL | 0 refills | Status: DC | PRN
Start: 2022-11-15 — End: 2022-12-06

## 2022-11-15 NOTE — Telephone Encounter (Signed)
Name of Medication: Hydrocodone 10-325 Name of [facility] Date and Quantity: 10-22-22 #75 Last Office Visit and Type: 10-22-22 Next Office Visit and Type: 01-20-23 Last Controlled Substance Agreement Date: 07-15-22 Last UDS:07-15-22

## 2022-12-06 ENCOUNTER — Other Ambulatory Visit: Payer: Self-pay | Admitting: Internal Medicine

## 2022-12-06 DIAGNOSIS — G8929 Other chronic pain: Secondary | ICD-10-CM

## 2022-12-06 DIAGNOSIS — F1120 Opioid dependence, uncomplicated: Secondary | ICD-10-CM

## 2022-12-07 MED ORDER — HYDROCODONE-ACETAMINOPHEN 10-325 MG PO TABS
1.0000 | ORAL_TABLET | Freq: Three times a day (TID) | ORAL | 0 refills | Status: DC | PRN
Start: 2022-12-07 — End: 2022-12-14

## 2022-12-07 NOTE — Telephone Encounter (Signed)
Name of Medication: Hydrocodone 10-325 Name of [facility]acy: [facility] Date and Quantity: 11-15-22 #75 Last Office Visit and Type: 10-22-22 Next Office Visit and Type: 01-20-23 Last Controlled Substance Agreement Date: 07-15-22 Last UDS:07-15-22

## 2022-12-13 ENCOUNTER — Encounter: Payer: Self-pay | Admitting: Internal Medicine

## 2022-12-13 DIAGNOSIS — F1120 Opioid dependence, uncomplicated: Secondary | ICD-10-CM

## 2022-12-13 DIAGNOSIS — G8929 Other chronic pain: Secondary | ICD-10-CM

## 2022-12-14 MED ORDER — HYDROCODONE-ACETAMINOPHEN 10-325 MG PO TABS
1.0000 | ORAL_TABLET | Freq: Three times a day (TID) | ORAL | 0 refills | Status: AC | PRN
Start: 2022-12-14 — End: ?

## 2023-01-06 ENCOUNTER — Other Ambulatory Visit: Payer: Self-pay | Admitting: Internal Medicine

## 2023-01-06 DIAGNOSIS — G8929 Other chronic pain: Secondary | ICD-10-CM

## 2023-01-06 DIAGNOSIS — F1120 Opioid dependence, uncomplicated: Secondary | ICD-10-CM

## 2023-01-07 MED ORDER — HYDROCODONE-ACETAMINOPHEN 10-325 MG PO TABS
1.0000 | ORAL_TABLET | Freq: Three times a day (TID) | ORAL | 0 refills | Status: DC | PRN
Start: 2023-01-07 — End: 2023-01-31

## 2023-01-15 ENCOUNTER — Other Ambulatory Visit: Payer: Self-pay | Admitting: Internal Medicine

## 2023-01-20 ENCOUNTER — Telehealth: Payer: Medicare Other | Admitting: Internal Medicine

## 2023-01-20 ENCOUNTER — Encounter: Payer: Self-pay | Admitting: Internal Medicine

## 2023-01-20 DIAGNOSIS — M549 Dorsalgia, unspecified: Secondary | ICD-10-CM | POA: Diagnosis not present

## 2023-01-20 DIAGNOSIS — G8929 Other chronic pain: Secondary | ICD-10-CM

## 2023-01-20 DIAGNOSIS — F1120 Opioid dependence, uncomplicated: Secondary | ICD-10-CM | POA: Diagnosis not present

## 2023-01-20 NOTE — Progress Notes (Signed)
Subjective:    Patient ID: Catherine Griffin, female    DOB: 11/22/1955, 67 y.o.   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 8 g 2   aspirin 81 MG tablet Take 81 mg by mouth daily.     cholecalciferol (VITAMIN D) 1000 UNITS tablet Take 1,000 Units by mouth daily.     cyclobenzaprine (FLEXERIL) 10 MG tablet TAKE 1/2-1 TABLET (5-10 MG TOTAL) BY MOUTH AT BEDTIME AS NEEDED. 30 tablet 1   HYDROcodone-acetaminophen (NORCO) 10-325 MG tablet Take 1 tablet by mouth 3 (three) times daily as needed. 75 tablet 0   levothyroxine (SYNTHROID) 50 MCG tablet TAKE 1 TABLET BY MOUTH EVERY DAY BEFORE BREAKFAST 90 tablet 3   No current facility-administered medications on file prior to visit.    Allergies  Allergen Reactions   Codeine Phosphate [Codeine] Nausea Only   Indomethacin     REACTION: Rash/hives/swelling    Past Medical History:  Diagnosis Date   Abdominal pain    Abdominal pain, right upper quadrant    Actinic keratosis    04/25/07   Aphthous ulcer of mouth    Arthritis    Back pain    04/25/07   Chest pain    04/25/07   Diverticulosis of colon    02/28/09   Dry age-related macular degeneration    Fatigue    05/30/08   Hemorrhoid    12/01/10   Hepatitis C    Hyperlipidemia    Hypothyroidism     Hypothyroidism    Lichen sclerosus    Osteopenia    Osteoporosis    04/25/07   RUQ abdominal pain    02/07/09   Spinal stenosis    Thyroid disease    hypothyrodism   Vaginitis    10/01/10   Wears dentures    partial upper and lower    Past Surgical History:  Procedure Laterality Date   ABDOMINAL HYSTERECTOMY     COLONOSCOPY WITH PROPOFOL N/A 06/21/2017   Procedure: COLONOSCOPY WITH PROPOFOL;  Surgeon: Pasty Spillers, MD;  Location: [facility];  Service: Endoscopy;  Laterality: N/A;   ESOPHAGOGASTRODUODENOSCOPY (EGD) WITH PROPOFOL N/A 06/21/2017   Procedure: ESOPHAGOGASTRODUODENOSCOPY (EGD) WITH PROPOFOL;  Surgeon: Pasty Spillers, MD;  Location: [facility];  Service: Endoscopy;  Laterality: N/A;   TONSILLECTOMY      Family History  Problem Relation Age of Onset   Heart disease Father    Arthritis Father    Other Father        joint replacement   Cancer Mother        lung   Anemia Sister    Other Sister        CHF   Breast cancer Sister 25   Cancer Sister        1 sister died of metastatic cancer  Congestive Heart Failure Sister    Cancer Brother        lung cancer   Coronary artery disease Neg Hx    Diabetes Neg Hx     Social History   Socioeconomic History   Marital status: Married    Spouse name: Not on file   Number of children: Not on file   Years of education: Not on file   Highest education level: Not on file  Occupational History   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  Tobacco Use   Smoking status: Every Day    Current packs/day: 0.00    Types: Cigarettes    Last attempt to quit: 05/13/2021    Years since quitting: 1.6    Passive exposure: Past   Smokeless tobacco: Never   Tobacco comments:    since age 53  Vaping Use   Vaping status: Never Used  Substance and Sexual Activity   Alcohol use: No    Alcohol/week: 0.0 standard drinks of alcohol   Drug use: No   Sexual activity: Not on file   Other Topics Concern   Not on file  Social History Narrative   2 step children      Has living will   Health care POA is husband---sister [name] would be alternate   Would accept resuscitation   No prolonged tube feeds if cognitively unaware   Social Determinants of Health   Financial Resource Strain: Not on file  Food Insecurity: Not on file  Transportation Needs: Not on file  Physical Activity: Not on file  Stress: Not on file  Social Connections: Not on file  Intimate Partner Violence: Not on file   Review of Systems Bowels move fine--uses stool softeners Sleeps okay Appetite is fine    Objective:   Physical Exam Constitutional:      Appearance: Normal appearance.  Neurological:     Mental Status: She is alert.  Psychiatric:        Mood and Affect: Mood normal.        Behavior: Behavior normal.            Assessment & Plan:

## 2023-01-20 NOTE — Assessment & Plan Note (Signed)
PDMP reviewed No concerns 

## 2023-01-20 NOTE — Assessment & Plan Note (Signed)
Doing about the same May be actually worse in the off season--just sitting around Suggested she try some walking or other exercise Continues on the hydrocodone 10/325 two to three times daily

## 2023-01-31 ENCOUNTER — Other Ambulatory Visit: Payer: Self-pay | Admitting: Internal Medicine

## 2023-01-31 DIAGNOSIS — F1120 Opioid dependence, uncomplicated: Secondary | ICD-10-CM

## 2023-01-31 DIAGNOSIS — G8929 Other chronic pain: Secondary | ICD-10-CM

## 2023-02-01 MED ORDER — HYDROCODONE-ACETAMINOPHEN 10-325 MG PO TABS
1.0000 | ORAL_TABLET | Freq: Three times a day (TID) | ORAL | 0 refills | Status: DC | PRN
Start: 2023-02-01 — End: 2023-02-24

## 2023-02-01 NOTE — Telephone Encounter (Signed)
Name of Medication: Hydrocodone 10-325 Name of [facility]acy: [facility] or Written Date and Quantity: 01-07-23 #75 Last Office Visit and Type: 01-20-23 Next Office Visit and Type: No Future OV Last Controlled Substance Agreement Date: 07-15-22 Last UDS:07-15-22

## 2023-02-01 NOTE — Telephone Encounter (Signed)
Spoke to pt, scheduled awv for 08/04/22

## 2023-02-24 ENCOUNTER — Other Ambulatory Visit: Payer: Self-pay | Admitting: Internal Medicine

## 2023-02-24 DIAGNOSIS — G8929 Other chronic pain: Secondary | ICD-10-CM

## 2023-02-24 DIAGNOSIS — F1120 Opioid dependence, uncomplicated: Secondary | ICD-10-CM

## 2023-02-24 MED ORDER — HYDROCODONE-ACETAMINOPHEN 10-325 MG PO TABS: 1.0000 | ORAL_TABLET | Freq: Three times a day (TID) | ORAL | 0 refills | Status: DC | PRN

## 2023-02-24 NOTE — Telephone Encounter (Signed)
Name of Medication: Hydrocodone 10-325 Name of Pharmacy: [facility] Date and Quantity: 02-01-23 #75 Last Office Visit and Type: 01-20-23 Next Office Visit and Type: 08-04-23 Last Controlled Substance Agreement Date: 07-15-22 Last UDS:07-15-22

## 2023-03-22 ENCOUNTER — Other Ambulatory Visit: Payer: Self-pay | Admitting: Internal Medicine

## 2023-03-22 DIAGNOSIS — F1120 Opioid dependence, uncomplicated: Secondary | ICD-10-CM

## 2023-03-22 DIAGNOSIS — G8929 Other chronic pain: Secondary | ICD-10-CM

## 2023-03-22 MED ORDER — HYDROCODONE-ACETAMINOPHEN 10-325 MG PO TABS: 1.0000 | ORAL_TABLET | Freq: Three times a day (TID) | ORAL | 0 refills | Status: DC | PRN

## 2023-03-22 NOTE — Telephone Encounter (Signed)
Name of Medication: Hydrocodone 10-325 Name of [facility]acy: [facility] Date and Quantity: 02-24-23 #75 Last Office Visit and Type: 01-20-23 Next Office Visit and Type: 08-04-23 Last Controlled Substance Agreement Date: 07-15-22 Last UDS:07-15-22

## 2023-04-06 ENCOUNTER — Other Ambulatory Visit: Payer: Self-pay | Admitting: Internal Medicine

## 2023-04-14 ENCOUNTER — Other Ambulatory Visit: Payer: Self-pay | Admitting: Internal Medicine

## 2023-04-14 DIAGNOSIS — F1120 Opioid dependence, uncomplicated: Secondary | ICD-10-CM

## 2023-04-14 DIAGNOSIS — G8929 Other chronic pain: Secondary | ICD-10-CM

## 2023-04-14 MED ORDER — HYDROCODONE-ACETAMINOPHEN 10-325 MG PO TABS: 1.0000 | ORAL_TABLET | Freq: Three times a day (TID) | ORAL | 0 refills | Status: DC | PRN

## 2023-04-14 NOTE — Telephone Encounter (Signed)
 Refill request for hydrocodone 10-325 mg tabs  LOV - 01/20/23 Next OV - 08/04/23 Last refill - 03/22/23 #75/0

## 2023-04-17 ENCOUNTER — Other Ambulatory Visit: Payer: Self-pay | Admitting: Internal Medicine

## 2023-04-28 ENCOUNTER — Other Ambulatory Visit: Payer: Self-pay | Admitting: Internal Medicine

## 2023-04-28 DIAGNOSIS — Z1231 Encounter for screening mammogram for malignant neoplasm of breast: Secondary | ICD-10-CM

## 2023-05-07 ENCOUNTER — Other Ambulatory Visit: Payer: Self-pay | Admitting: Internal Medicine

## 2023-05-07 DIAGNOSIS — F1120 Opioid dependence, uncomplicated: Secondary | ICD-10-CM

## 2023-05-07 DIAGNOSIS — G8929 Other chronic pain: Secondary | ICD-10-CM

## 2023-05-09 MED ORDER — HYDROCODONE-ACETAMINOPHEN 10-325 MG PO TABS: 1.0000 | ORAL_TABLET | Freq: Three times a day (TID) | ORAL | 0 refills | Status: DC | PRN

## 2023-05-09 NOTE — Telephone Encounter (Signed)
Name of Medication: Hydrocodone 10-325 Name of Pharmacy: [facility] Date and Quantity: 04-14-23 #75 Last Office Visit and Type: 01-20-23 Next Office Visit and Type: 08-04-23 Last Controlled Substance Agreement Date: 07-15-22 Last UDS:07-15-22

## 2023-06-01 ENCOUNTER — Other Ambulatory Visit: Payer: Self-pay | Admitting: Internal Medicine

## 2023-06-01 DIAGNOSIS — G8929 Other chronic pain: Secondary | ICD-10-CM

## 2023-06-01 DIAGNOSIS — F1120 Opioid dependence, uncomplicated: Secondary | ICD-10-CM

## 2023-06-01 NOTE — Telephone Encounter (Signed)
 LOV: 07/15/22 CPE  NOV: 08/04/23 CPE  Last refill: 05/09/23 #75 w/ 0 refills

## 2023-06-02 MED ORDER — HYDROCODONE-ACETAMINOPHEN 10-325 MG PO TABS: 1.0000 | ORAL_TABLET | Freq: Three times a day (TID) | ORAL | 0 refills | Status: DC | PRN

## 2023-06-14 ENCOUNTER — Other Ambulatory Visit: Payer: Self-pay | Admitting: Internal Medicine

## 2023-06-23 ENCOUNTER — Other Ambulatory Visit: Payer: Self-pay | Admitting: Internal Medicine

## 2023-06-23 DIAGNOSIS — G8929 Other chronic pain: Secondary | ICD-10-CM

## 2023-06-23 DIAGNOSIS — F1120 Opioid dependence, uncomplicated: Secondary | ICD-10-CM

## 2023-06-24 MED ORDER — HYDROCODONE-ACETAMINOPHEN 10-325 MG PO TABS: 1.0000 | ORAL_TABLET | Freq: Three times a day (TID) | ORAL | 0 refills | Status: DC | PRN

## 2023-06-24 NOTE — Telephone Encounter (Signed)
 Name of Medication: Hydrocodone 10-325 Name of Pharmacy: [facility] Date and Quantity: 06-02-23 #75 Last Office Visit and Type: 01-20-23 Next Office Visit and Type: 08-04-23 Last Controlled Substance Agreement Date: 07-15-22 Last UDS:07-15-22

## 2023-06-29 ENCOUNTER — Telehealth: Payer: Self-pay | Admitting: Pharmacy Technician

## 2023-06-29 ENCOUNTER — Other Ambulatory Visit (HOSPITAL_COMMUNITY): Payer: Self-pay

## 2023-06-29 NOTE — Telephone Encounter (Signed)
 Pharmacy Patient Advocate Encounter   Received notification from CoverMyMeds that prior authorization for Hydrocodone 10-325mg  is required/requested.   Insurance verification completed.   The patient is insured through [employer] .   Per test claim: Refill too soon. PA is not needed at this time. Medication was filled 06/27/23. Next eligible fill date is 07/03/23.

## 2023-07-05 ENCOUNTER — Ambulatory Visit
Admission: RE | Admit: 2023-07-05 | Discharge: 2023-07-05 | Disposition: A | Discharge status: Home / Self Care | Source: Ambulatory Visit | Attending: Internal Medicine | Admitting: Internal Medicine

## 2023-07-05 DIAGNOSIS — Z1231 Encounter for screening mammogram for malignant neoplasm of breast: Secondary | ICD-10-CM | POA: Insufficient documentation

## 2023-07-07 ENCOUNTER — Encounter: Payer: Self-pay | Admitting: Internal Medicine

## 2023-07-10 ENCOUNTER — Other Ambulatory Visit: Payer: Self-pay | Admitting: Internal Medicine

## 2023-07-12 ENCOUNTER — Encounter: Payer: Self-pay | Admitting: Internal Medicine

## 2023-07-12 ENCOUNTER — Ambulatory Visit (INDEPENDENT_AMBULATORY_CARE_PROVIDER_SITE_OTHER): Admitting: Internal Medicine

## 2023-07-12 VITALS — BP 102/70 | HR 105 | Temp 98.2°F | Ht 61.75 in | Wt 119.0 lb

## 2023-07-12 DIAGNOSIS — F1120 Opioid dependence, uncomplicated: Secondary | ICD-10-CM

## 2023-07-12 DIAGNOSIS — G8929 Other chronic pain: Secondary | ICD-10-CM | POA: Diagnosis not present

## 2023-07-12 DIAGNOSIS — M545 Low back pain, unspecified: Secondary | ICD-10-CM

## 2023-07-12 NOTE — Assessment & Plan Note (Signed)
 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

## 2023-07-12 NOTE — Progress Notes (Signed)
 Subjective:    Patient ID: Catherine Griffin, female    DOB: [birth year], 68 y.o.   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 8 g 2   aspirin 81 MG tablet Take 81 mg by mouth daily.     cholecalciferol (VITAMIN D) 1000 UNITS tablet Take 1,000 Units by mouth daily.     cyclobenzaprine (FLEXERIL) 10 MG tablet TAKE 1/2-1 TABLET (5-10 MG TOTAL) BY MOUTH AT BEDTIME AS NEEDED. 30 tablet 1   HYDROcodone-acetaminophen (NORCO) 10-325 MG tablet Take 1 tablet by mouth 3 (three) times daily as needed. 75 tablet 0   levothyroxine (SYNTHROID) 50 MCG tablet TAKE 1 TABLET BY MOUTH EVERY DAY BEFORE BREAKFAST 90 tablet 3   No current facility-administered medications on file prior to visit.    Allergies  Allergen Reactions   Codeine Phosphate [Codeine] Nausea Only   Indomethacin     REACTION: Rash/hives/swelling    Past Medical History:  Diagnosis Date   Abdominal pain    Abdominal pain, right upper quadrant    Actinic keratosis    04/25/07   Aphthous ulcer of mouth    Arthritis    Back pain    04/25/07   Chest pain    04/25/07   Diverticulosis of colon    02/28/09   Dry age-related macular degeneration    Fatigue    05/30/08   Hemorrhoid    12/01/10   Hepatitis C    Hyperlipidemia    Hypothyroidism    Hypothyroidism    Lichen sclerosus    Osteopenia    Osteoporosis    04/25/07   RUQ abdominal pain     02/07/09   Spinal stenosis    Thyroid disease    hypothyrodism   Vaginitis    10/01/10   Wears dentures    partial upper and lower    Past Surgical History:  Procedure Laterality Date   ABDOMINAL HYSTERECTOMY     COLONOSCOPY WITH PROPOFOL N/A 06/21/2017   Procedure: COLONOSCOPY WITH PROPOFOL;  Surgeon: Pasty Spillers, MD;  Location: [facility];  Service: Endoscopy;  Laterality: N/A;   ESOPHAGOGASTRODUODENOSCOPY (EGD) WITH PROPOFOL N/A 06/21/2017   Procedure: ESOPHAGOGASTRODUODENOSCOPY (EGD) WITH PROPOFOL;  Surgeon: Pasty Spillers, MD;  Location: [facility];  Service: Endoscopy;  Laterality: N/A;   TONSILLECTOMY      Family History  Problem Relation Age of Onset   Heart disease Father    Arthritis Father    Other Father        joint replacement   Cancer Mother        lung   Anemia Sister    Other Sister        CHF   Breast cancer Sister 33   Cancer Sister        1 sister died of metastatic cancer   Congestive Heart Failure Sister  Cancer Brother        lung cancer   Coronary artery disease Neg Hx    Diabetes Neg Hx     Social History   Socioeconomic History   Marital status: Married    Spouse name: Not on file   Number of children: Not on file   Years of education: Not on file   Highest education level: 12th grade  Occupational History   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  Tobacco Use   Smoking status: Every Day    Current packs/day: 0.00    Types: Cigarettes    Last attempt to quit: 05/13/2021    Years since quitting: 2.1    Passive exposure: Past   Smokeless tobacco: Never   Tobacco comments:    since age 51  Vaping Use   Vaping status: Never Used  Substance and Sexual Activity   Alcohol use: No    Alcohol/week: 0.0 standard drinks of alcohol   Drug use: No   Sexual activity: Not on file  Other Topics Concern   Not on file  Social History Narrative   2 step children      Has living will    Health care POA is husband---sister [name] would be alternate   Would accept resuscitation   No prolonged tube feeds if cognitively unaware   Social Drivers of Health   Financial Resource Strain: Low Risk  (07/11/2023)   Overall Financial Resource Strain (CARDIA)    Difficulty of Paying Living Expenses: Not hard at all  Food Insecurity: No Food Insecurity (07/11/2023)   Hunger Vital Sign    Worried About Running Out of Food in the Last Year: Never true    Ran Out of Food in the Last Year: Never true  Transportation Needs: No Transportation Needs (07/11/2023)   PRAPARE - Administrator, Civil Service (Medical): No    Lack of Transportation (Non-Medical): No  Physical Activity: Sufficiently Active (07/11/2023)   Exercise Vital Sign    Days of Exercise per Week: 3 days    Minutes of Exercise per Session: 120 min  Stress: No Stress Concern Present (07/11/2023)   [employer] of Occupational Health - Occupational Stress Questionnaire    Feeling of Stress : Only a little  Social Connections: Moderately Integrated (07/11/2023)   Social Connection and Isolation Panel [NHANES]    Frequency of Communication with Friends and Family: More than three times a week    Frequency of Social Gatherings with Friends and Family: Once a week    Attends Religious Services: 1 to 4 times per year    Active Member of [employer] or Organizations: No    Attends Engineer, structural: Not on file    Marital Status: Married  Catering manager Violence: Not on file   Review of Systems Not sleeping great in past 2 months--moves from bed to couch, etc Bowels are slow--uses 1 colace (inconsistent)     Objective:   Physical Exam Constitutional:      Appearance: Normal appearance.  Neurological:     Mental Status: She is alert.  Psychiatric:        Mood and Affect: Mood normal.        Behavior: Behavior normal.            Assessment & Plan:

## 2023-07-12 NOTE — Assessment & Plan Note (Signed)
 PDMP reviewed No concerns Will have to wait to refill despite losing some of her med

## 2023-07-19 ENCOUNTER — Other Ambulatory Visit: Payer: Self-pay | Admitting: Internal Medicine

## 2023-07-19 ENCOUNTER — Ambulatory Visit (INDEPENDENT_AMBULATORY_CARE_PROVIDER_SITE_OTHER)

## 2023-07-19 ENCOUNTER — Encounter: Payer: Self-pay | Admitting: Internal Medicine

## 2023-07-19 VITALS — BP 102/70 | Ht 61.75 in | Wt 119.0 lb

## 2023-07-19 DIAGNOSIS — Z0000 Encounter for general adult medical examination without abnormal findings: Secondary | ICD-10-CM | POA: Diagnosis not present

## 2023-07-19 DIAGNOSIS — F1120 Opioid dependence, uncomplicated: Secondary | ICD-10-CM

## 2023-07-19 DIAGNOSIS — G8929 Other chronic pain: Secondary | ICD-10-CM

## 2023-07-19 MED ORDER — HYDROCODONE-ACETAMINOPHEN 10-325 MG PO TABS
1.0000 | ORAL_TABLET | Freq: Three times a day (TID) | ORAL | 0 refills | Status: DC | PRN
Start: 2023-07-19 — End: 2023-08-11

## 2023-07-19 NOTE — Progress Notes (Signed)
 Because this visit was a virtual/telehealth visit,  certain criteria was not obtained, such a blood pressure, CBG if applicable, and timed get up and go. Any medications not marked as "taking" were not mentioned during the medication reconciliation part of the visit. Any vitals not documented were not able to be obtained due to this being a telehealth visit or patient was unable to self-report a recent blood pressure reading due to a lack of equipment at home via telehealth. Vitals that have been documented are verbally provided by the patient.   Subjective:   Catherine Griffin is a 68 y.o. who presents for a Medicare Wellness preventive visit.  Visit Complete: Virtual I connected with  Catherine Griffin on 07/19/23 by a audio enabled telemedicine application and verified that I am speaking with the correct person using two identifiers.  Patient Location: Home  Provider Location: Home Office  I discussed the limitations of evaluation and management by telemedicine. The patient expressed understanding and agreed to proceed.  Vital Signs: Because this visit was a virtual/telehealth visit, some criteria may be missing or patient reported. Any vitals not documented were not able to be obtained and vitals that have been documented are patient reported.  VideoDeclined- This patient declined Librarian, academic. Therefore the visit was completed with audio only.  Persons Participating in Visit: Patient.  AWV Questionnaire: Yes: Patient Medicare AWV questionnaire was completed by the patient on 07/18/2023; I have confirmed that all information answered by patient is correct and no changes since this date.  Cardiac Risk Factors include: advanced age (>36men, >23 women);hypertension;dyslipidemia;Other (see comment), Risk factor comments: COPD     Objective:    Today's Vitals   07/19/23 1311  BP: 102/70  Weight: 119 lb (54 kg)  Height: 5' 1.75" (1.568 m)   Body  mass index is 21.94 kg/m.     07/19/2023    1:10 PM 06/21/2017    8:58 AM  Advanced Directives  Does Patient Have a Medical Advance Directive? Yes Yes  Type of Advance Directive Living will Living will  Does patient want to make changes to medical advance directive? No - Patient declined No - Patient declined  Would patient like information on creating a medical advance directive? No - Patient declined     Current Medications (verified) Outpatient Encounter Medications as of 07/19/2023  Medication Sig   albuterol (VENTOLIN HFA) 108 (90 Base) MCG/ACT inhaler Inhale 2 puffs into the lungs every 6 (six) hours as needed for wheezing or shortness of breath.   aspirin 81 MG tablet Take 81 mg by mouth daily.   cholecalciferol (VITAMIN D) 1000 UNITS tablet Take 1,000 Units by mouth daily.   cyclobenzaprine (FLEXERIL) 10 MG tablet TAKE 1/2-1 TABLET (5-10 MG TOTAL) BY MOUTH AT BEDTIME AS NEEDED.   HYDROcodone-acetaminophen (NORCO) 10-325 MG tablet Take 1 tablet by mouth 3 (three) times daily as needed.   levothyroxine (SYNTHROID) 50 MCG tablet TAKE 1 TABLET BY MOUTH EVERY DAY BEFORE BREAKFAST   No facility-administered encounter medications on file as of 07/19/2023.    Allergies (verified) Codeine phosphate [codeine] and Indomethacin   History: Past Medical History:  Diagnosis Date   Abdominal pain    Abdominal pain, right upper quadrant    Actinic keratosis    04/25/07   Aphthous ulcer of mouth    Arthritis    Back pain    04/25/07   Chest pain    04/25/07   Diverticulosis of colon  02/28/09   Dry age-related macular degeneration    Fatigue    05/30/08   Hemorrhoid    12/01/10   Hepatitis C    Hyperlipidemia    Hypothyroidism    Hypothyroidism    Lichen sclerosus    Osteopenia    Osteoporosis    04/25/07   RUQ abdominal pain    02/07/09   Spinal stenosis    Thyroid disease    hypothyrodism   Vaginitis    10/01/10   Wears dentures    partial upper and lower   Past  Surgical History:  Procedure Laterality Date   ABDOMINAL HYSTERECTOMY     COLONOSCOPY WITH PROPOFOL N/A 06/21/2017   Procedure: COLONOSCOPY WITH PROPOFOL;  Surgeon: Pasty Spillers, MD;  Location: [facility];  Service: Endoscopy;  Laterality: N/A;   ESOPHAGOGASTRODUODENOSCOPY (EGD) WITH PROPOFOL N/A 06/21/2017   Procedure: ESOPHAGOGASTRODUODENOSCOPY (EGD) WITH PROPOFOL;  Surgeon: Pasty Spillers, MD;  Location: [facility];  Service: Endoscopy;  Laterality: N/A;   TONSILLECTOMY     Family History  Problem Relation Age of Onset   Heart disease Father    Arthritis Father    Other Father        joint replacement   Cancer Mother        lung   Anemia Sister    Other Sister        CHF   Breast cancer Sister 51   Cancer Sister        1 sister died of metastatic cancer   Congestive Heart Failure Sister    Cancer Brother        lung cancer   Coronary artery disease Neg Hx    Diabetes Neg Hx    Social History   Socioeconomic History   Marital status: Married    Spouse name: Not on file   Number of children: Not on file   Years of education: Not on file   Highest education level: 12th grade  Occupational History   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  Tobacco Use   Smoking status: Every Day    Current packs/day: 0.00    Types: Cigarettes    Last attempt to quit: 05/13/2021    Years since quitting: 2.1    Passive exposure: Past   Smokeless tobacco: Never   Tobacco comments:    since age 39  Vaping Use   Vaping status: Never Used  Substance and Sexual Activity   Alcohol use: No    Alcohol/week: 0.0 standard drinks of alcohol   Drug use: No   Sexual activity: Not on file  Other Topics Concern   Not on file  Social History Narrative   2 step children      Has living will   Health care POA is husband---sister Catherine Griffin would be alternate   Would accept resuscitation   No prolonged tube feeds if cognitively unaware    Social Drivers of Health   Financial Resource Strain: Low Risk  (07/19/2023)   Overall Financial Resource Strain (CARDIA)    Difficulty of Paying Living Expenses: Not hard at all  Food Insecurity: No Food Insecurity (07/19/2023)   Hunger Vital Sign    Worried About Running Out of Food in the Last Year: Never true    Ran Out of Food in the Last Year: Never true  Transportation Needs: No Transportation Needs (07/19/2023)   PRAPARE - Administrator, Civil Service (Medical):  No    Lack of Transportation (Non-Medical): No  Physical Activity: Sufficiently Active (07/19/2023)   Exercise Vital Sign    Days of Exercise per Week: 3 days    Minutes of Exercise per Session: 90 min  Stress: No Stress Concern Present (07/19/2023)   [employer] of Occupational Health - Occupational Stress Questionnaire    Feeling of Stress : Only a little  Social Connections: Moderately Isolated (07/19/2023)   Social Connection and Isolation Panel [NHANES]    Frequency of Communication with Friends and Family: More than three times a week    Frequency of Social Gatherings with Friends and Family: Once a week    Attends Religious Services: Never    Database administrator or Organizations: No    Attends Engineer, structural: Never    Marital Status: Married    Tobacco Counseling Ready to quit: Not Answered Counseling given: Not Answered Tobacco comments: since age 76    Clinical Intake:  Pre-visit preparation completed: Yes  Pain : No/denies pain     BMI - recorded: 21.94 Nutritional Status: BMI of 19-24  Normal Nutritional Risks: None Diabetes: No  No results found for: "HGBA1C"   How often do you need to have someone help you when you read instructions, pamphlets, or other written materials from your doctor or pharmacy?: 1 - Never What is the last grade level you completed in school?: 12th grade  Interpreter Needed?: No  Information entered by :: [employer]   Activities of Daily Living     07/18/2023    1:16 PM  In your present state of health, do you have any difficulty performing the following activities:  Hearing? 0  Vision? 0  Difficulty concentrating or making decisions? 0  Walking or climbing stairs? 0  Dressing or bathing? 0  Doing errands, shopping? 0  Preparing Food and eating ? N  Using the Toilet? N  In the past six months, have you accidently leaked urine? N  Do you have problems with loss of bowel control? N  Managing your Medications? N  Managing your Finances? N  Housekeeping or managing your Housekeeping? N    Patient Care Team: Karie Schwalbe, MD as PCP - General  Indicate any recent Medical Services you may have received from other than [facility] providers in the past year (date may be approximate).     Assessment:   This is a routine wellness examination for Catherine Griffin.  Hearing/Vision screen Hearing Screening - Comments:: Patient has no issues hearing Vision Screening - Comments:: Patient wears glasses   Goals Addressed             This Visit's Progress    Patient Stated       To retire       Depression Screen     07/19/2023    1:17 PM 07/15/2022   12:40 PM 07/15/2022   12:09 PM 07/13/2021    3:39 PM  PHQ 2/9 Scores  PHQ - 2 Score 2 1 0 0  PHQ- 9 Score 4       Fall Risk     07/18/2023    1:16 PM 07/15/2022   12:40 PM 07/15/2022   12:09 PM 07/13/2021    3:39 PM  Fall Risk   Falls in the past year? 0 0 0 0  Number falls in past yr: 0  0   Injury with Fall? 0  0   Risk for fall due to : No Fall  Risks  No Fall Risks   Follow up Falls prevention discussed;Falls evaluation completed  Falls evaluation completed     MEDICARE RISK AT HOME:  Medicare Risk at Home Any stairs in or around the home?: (Patient-Rptd) Yes If so, are there any without handrails?: (Patient-Rptd) No Home free of loose throw rugs in walkways, pet beds, electrical cords, etc?: (Patient-Rptd) Yes Adequate lighting in  your home to reduce risk of falls?: (Patient-Rptd) Yes Life alert?: (Patient-Rptd) No Use of a cane, walker or w/c?: (Patient-Rptd) No Grab bars in the bathroom?: (Patient-Rptd) No Shower chair or bench in shower?: (Patient-Rptd) No Elevated toilet seat or a handicapped toilet?: (Patient-Rptd) No  TIMED UP AND GO:  Was the test performed?  No  Cognitive Function: 6CIT completed        07/19/2023    1:13 PM  6CIT Screen  What Year? 0 points  What month? 0 points  What time? 0 points  Count back from 20 0 points  Months in reverse 0 points  Repeat phrase 0 points  Total Score 0 points    Immunizations Immunization History  Administered Date(s) Administered   Fluad Quad(high Dose 65+) 01/18/2019, 02/28/2020   Influenza,inj,Quad PF,6+ Mos 02/14/2017, 03/02/2018   Pneumococcal Conjugate-13 01/18/2019   Pneumococcal Polysaccharide-23 07/01/2014, 06/05/2020   Td 07/11/2009   Tdap 07/11/2009    Screening Tests Health Maintenance  Topic Date Due   Zoster Vaccines- Shingrix (1 of 2) Never done   DEXA SCAN  Never done   DTaP/Tdap/Td (3 - Td or Tdap) 07/12/2019   INFLUENZA VACCINE  11/11/2023   Medicare Annual Wellness (AWV)  07/18/2024   MAMMOGRAM  07/04/2025   Colonoscopy  06/22/2027   Pneumonia Vaccine 57+ Years old  Completed   HPV VACCINES  Aged Out   COVID-19 Vaccine  Discontinued   Hepatitis C Screening  Discontinued    Health Maintenance  Health Maintenance Due  Topic Date Due   Zoster Vaccines- Shingrix (1 of 2) Never done   DEXA SCAN  Never done   DTaP/Tdap/Td (3 - Td or Tdap) 07/12/2019   Health Maintenance Items Addressed:discussed vaccines and bone density . Patient declined at the moment   Additional Screening:  Vision Screening: Recommended annual ophthalmology exams for early detection of glaucoma and other disorders of the eye.  Dental Screening: Recommended annual dental exams for proper oral hygiene  Community Resource Referral / Chronic Care  Management: CRR required this visit?  No   CCM required this visit?  No     Plan:     I have personally reviewed and noted the following in the patient's chart:   Medical and social history Use of alcohol, tobacco or illicit drugs  Current medications and supplements including opioid prescriptions. Patient is not currently taking opioid prescriptions. Functional ability and status Nutritional status Physical activity Advanced directives List of other physicians Hospitalizations, surgeries, and ER visits in previous 12 months Vitals Screenings to include cognitive, depression, and falls Referrals and appointments  In addition, I have reviewed and discussed with patient certain preventive protocols, quality metrics, and best practice recommendations. A written personalized care plan for preventive services as well as general preventive health recommendations were provided to patient.     Rudi Heap, [place]   07/19/2023   After Visit Summary: (MyChart) Due to this being a telephonic visit, the after visit summary with patients personalized plan was offered to patient via MyChart   Notes: Nothing significant to report at this time.

## 2023-07-19 NOTE — Patient Instructions (Signed)
 Catherine Griffin , Thank you for taking time to come for your Medicare Wellness Visit. I appreciate your ongoing commitment to your health goals. Please review the following plan we discussed and let me know if I can assist you in the future.   Referrals/Orders/Follow-Ups/Clinician Recommendations: follow up in 1 year.  This is a list of the screening recommended for you and due dates:  Health Maintenance  Topic Date Due   Zoster (Shingles) Vaccine (1 of 2) Never done   DEXA scan (bone density measurement)  Never done   DTaP/Tdap/Td vaccine (3 - Td or Tdap) 07/12/2019   Flu Shot  11/11/2023   Medicare Annual Wellness Visit  07/18/2024   Mammogram  07/04/2025   Colon Cancer Screening  06/22/2027   Pneumonia Vaccine  Completed   HPV Vaccine  Aged Out   COVID-19 Vaccine  Discontinued   Hepatitis C Screening  Discontinued    Advanced directives: (Copy Requested) Please bring a copy of your health care power of attorney and living will to the office to be added to your chart at your convenience. You can mail to [facility] [address] [place], [place] [address] or email to [email or web address]  Next Medicare Annual Wellness Visit scheduled for next year: Yes

## 2023-07-19 NOTE — Telephone Encounter (Signed)
 Name of Medication: Hydrocodone 10-325 Name of Pharmacy: [facility] or Written Date and Quantity: 06-24-23 #75 Last Office Visit and Type: 07-12-23 Next Office Visit and Type: no future OV Last Controlled Substance Agreement Date: 07-15-22 Last UDS:07-15-22

## 2023-08-04 ENCOUNTER — Encounter: Payer: Medicare Other | Admitting: Internal Medicine

## 2023-08-11 ENCOUNTER — Other Ambulatory Visit: Payer: Self-pay | Admitting: Internal Medicine

## 2023-08-11 DIAGNOSIS — G8929 Other chronic pain: Secondary | ICD-10-CM

## 2023-08-11 DIAGNOSIS — F1120 Opioid dependence, uncomplicated: Secondary | ICD-10-CM

## 2023-08-12 MED ORDER — HYDROCODONE-ACETAMINOPHEN 10-325 MG PO TABS: 1.0000 | ORAL_TABLET | Freq: Three times a day (TID) | ORAL | 0 refills | Status: DC | PRN

## 2023-08-12 NOTE — Telephone Encounter (Signed)
 Name of Medication: Hydrocodone  10-325 Name of Pharmacy: [facility] or Written Date and Quantity: 07-19-23 #75 Last Office Visit and Type: 07-12-23 Next Office Visit and Type: 10-20-23 Last Controlled Substance Agreement Date: 07-15-22 Last UDS:07-15-22

## 2023-08-13 ENCOUNTER — Other Ambulatory Visit: Payer: Self-pay | Admitting: Internal Medicine

## 2023-08-23 ENCOUNTER — Encounter: Payer: Self-pay | Admitting: Internal Medicine

## 2023-08-23 ENCOUNTER — Telehealth (INDEPENDENT_AMBULATORY_CARE_PROVIDER_SITE_OTHER): Admitting: Internal Medicine

## 2023-08-23 DIAGNOSIS — R1013 Epigastric pain: Secondary | ICD-10-CM

## 2023-08-23 NOTE — Assessment & Plan Note (Signed)
 Not consistent with ulcer since after eating Small chance of gallbladder or pancreas issue but unlikely Will have her try 1-2 weeks of omeprazole  at bedtime----if symptoms better, continue for a month and then change to prn If pain persists, needs in person visit for exam and decision about plan (likely imaging and GI evaluation)

## 2023-08-23 NOTE — Progress Notes (Signed)
 Subjective:    Patient ID: Catherine Griffin, female    DOB: 09/27/1955, 68 y.o.   MRN: [id number]  HPI Video virtual visit due to abdominal pain Identification done Reviewed limitations and billing and she gave consent Participants--patient in her home and I am in my office  For 3 weeks--stomach pain Thought it was food poisoning at first---but just kept on Then she thought just indigestion---but now concerned about ulcer Pain is localized to epigastric area Pain is mostly after eating No acid reflux or belching No vomiting--some nausea No fever  Has tried pepto bismol--will help for a while--but then recurs  Current Outpatient Medications on File Prior to Visit  Medication Sig Dispense Refill   albuterol  (VENTOLIN  HFA) 108 (90 Base) MCG/ACT inhaler Inhale 2 puffs into the lungs every 6 (six) hours as needed for wheezing or shortness of breath. 8 g 2   aspirin 81 MG tablet Take 81 mg by mouth daily.     cholecalciferol (VITAMIN D) 1000 UNITS tablet Take 1,000 Units by mouth daily.     cyclobenzaprine  (FLEXERIL ) 10 MG tablet TAKE 1/2-1 TABLET (5-10 MG TOTAL) BY MOUTH AT BEDTIME AS NEEDED. 30 tablet 1   HYDROcodone -acetaminophen  (NORCO) 10-325 MG tablet Take 1 tablet by mouth 3 (three) times daily as needed. 75 tablet 0   levothyroxine  (SYNTHROID ) 50 MCG tablet TAKE 1 TABLET BY MOUTH EVERY DAY BEFORE BREAKFAST 90 tablet 3   No current facility-administered medications on file prior to visit.    Allergies  Allergen Reactions   Codeine Phosphate [Codeine] Nausea Only   Indomethacin     REACTION: Rash/hives/swelling    Past Medical History:  Diagnosis Date   Abdominal pain    Abdominal pain, right upper quadrant    Actinic keratosis    04/25/07   Aphthous ulcer of mouth    Arthritis    Back pain    04/25/07   Chest pain    04/25/07   Diverticulosis of colon    02/28/09   Dry age-related macular degeneration    Fatigue    05/30/08   Hemorrhoid    12/01/10    Hepatitis C    Hyperlipidemia    Hypothyroidism    Hypothyroidism    Lichen sclerosus    Osteopenia    Osteoporosis    04/25/07   RUQ abdominal pain    02/07/09   Spinal stenosis    Thyroid  disease    hypothyrodism   Vaginitis    10/01/10   Wears dentures    partial upper and lower    Past Surgical History:  Procedure Laterality Date   ABDOMINAL HYSTERECTOMY     COLONOSCOPY WITH PROPOFOL  N/A 06/21/2017   Procedure: COLONOSCOPY WITH PROPOFOL ;  Surgeon: Irby Mannan, MD;  Location[facility];  Service: Endoscopy;  Laterality: N/A;   ESOPHAGOGASTRODUODENOSCOPY (EGD) WITH PROPOFOL  N/A 06/21/2017   Procedure: ESOPHAGOGASTRODUODENOSCOPY (EGD) WITH PROPOFOL ;  Surgeon: Irby Mannan, MD;  Location: [facility];  Service: Endoscopy;  Laterality: N/A;   TONSILLECTOMY      Family History  Problem Relation Age of Onset   Heart disease Father    Arthritis Father    Other Father        joint replacement   Cancer Mother        lung   Anemia Sister    Other Sister        CHF   Breast cancer Sister 37   Cancer Sister  1 sister died of metastatic cancer   Congestive Heart Failure Sister    Cancer Brother        lung cancer   Coronary artery disease Neg Hx    Diabetes Neg Hx     Social History   Socioeconomic History   Marital status: Married    Spouse name: Not on file   Number of children: Not on file   Years of education: Not on file   Highest education level: 12th grade  Occupational History   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  Tobacco Use   Smoking status: Every Day    Current packs/day: 0.00    Types: Cigarettes    Last attempt to quit: 05/13/2021    Years since quitting: 2.2    Passive exposure: Past   Smokeless tobacco: Never   Tobacco comments:    since age 66  Vaping Use   Vaping status: Never Used  Substance and Sexual Activity   Alcohol use: No    Alcohol/week: 0.0 standard drinks of  alcohol   Drug use: No   Sexual activity: Not on file  Other Topics Concern   Not on file  Social History Narrative   2 step children      Has living will   Health care POA is husband---sister [name] would be alternate   Would accept resuscitation   No prolonged tube feeds if cognitively unaware   Social Drivers of Health   Financial Resource Strain: Low Risk  (07/19/2023)   Overall Financial Resource Strain (CARDIA)    Difficulty of Paying Living Expenses: Not hard at all  Food Insecurity: No Food Insecurity (07/19/2023)   Hunger Vital Sign    Worried About Running Out of Food in the Last Year: Never true    Ran Out of Food in the Last Year: Never true  Transportation Needs: No Transportation Needs (07/19/2023)   PRAPARE - Administrator, Civil Service (Medical): No    Lack of Transportation (Non-Medical): No  Physical Activity: Sufficiently Active (07/19/2023)   Exercise Vital Sign    Days of Exercise per Week: 3 days    Minutes of Exercise per Session: 90 min  Stress: No Stress Concern Present (07/19/2023)   [employer] of Occupational Health - Occupational Stress Questionnaire    Feeling of Stress : Only a little  Social Connections: Moderately Isolated (07/19/2023)   Social Connection and Isolation Panel [NHANES]    Frequency of Communication with Friends and Family: More than three times a week    Frequency of Social Gatherings with Friends and Family: Once a week    Attends Religious Services: Never    Database administrator or Organizations: No    Attends Banker Meetings: Never    Marital Status: Married  Catering manager Violence: Not At Risk (07/19/2023)   Humiliation, Afraid, Rape, and Kick questionnaire    Fear of Current or Ex-Partner: No    Emotionally Abused: No    Physically Abused: No    Sexually Abused: No   Review of Systems Eating okay--has cut down some No particular foods are a problem Hasn't lost weight    Objective:    Physical Exam Constitutional:      General: She is not in acute distress. Abdominal:     General: Abdomen is flat.     Comments: Does note mild epigastric tenderness  Neurological:     Mental Status: She is  alert.            Assessment & Plan:

## 2023-09-05 ENCOUNTER — Encounter: Payer: Self-pay | Admitting: Internal Medicine

## 2023-09-05 DIAGNOSIS — G8929 Other chronic pain: Secondary | ICD-10-CM

## 2023-09-05 DIAGNOSIS — F1120 Opioid dependence, uncomplicated: Secondary | ICD-10-CM

## 2023-09-06 MED ORDER — HYDROCODONE-ACETAMINOPHEN 10-325 MG PO TABS: 1.0000 | ORAL_TABLET | Freq: Three times a day (TID) | ORAL | 0 refills | Status: DC | PRN

## 2023-09-06 NOTE — Telephone Encounter (Signed)
 Name of Medication: Hydrocodone  10-325 Name of Pharmacy: [facility] or Written Date and Quantity: 08-12-23 #75 Last Office Visit and Type: 07-12-23 Next Office Visit and Type: 10-20-23 Last Controlled Substance Agreement Date: 07-15-22 Last UDS:07-15-22

## 2023-09-29 ENCOUNTER — Encounter: Payer: Self-pay | Admitting: Internal Medicine

## 2023-09-29 DIAGNOSIS — G8929 Other chronic pain: Secondary | ICD-10-CM

## 2023-09-29 DIAGNOSIS — F1120 Opioid dependence, uncomplicated: Secondary | ICD-10-CM

## 2023-09-30 MED ORDER — HYDROCODONE-ACETAMINOPHEN 10-325 MG PO TABS: 1.0000 | ORAL_TABLET | Freq: Three times a day (TID) | ORAL | 0 refills | Status: DC | PRN

## 2023-09-30 NOTE — Telephone Encounter (Addendum)
 Name of Medication: Hydrocodone  10-325 Name of Pharmacy: [facility] Last Fill or Written Date and Quantity: 09-06-23 #75 Last Office Visit and Type: 07-12-23 Next Office Visit and Type: 10-20-23 Last Controlled Substance Agreement Date: 07-15-22 Last UDS:07-15-22

## 2023-09-30 NOTE — Telephone Encounter (Signed)
[facility]

## 2023-10-18 ENCOUNTER — Other Ambulatory Visit: Payer: Self-pay | Admitting: Internal Medicine

## 2023-10-19 ENCOUNTER — Encounter: Payer: Self-pay | Admitting: Internal Medicine

## 2023-10-20 ENCOUNTER — Encounter: Payer: Self-pay | Admitting: Internal Medicine

## 2023-10-20 ENCOUNTER — Ambulatory Visit (INDEPENDENT_AMBULATORY_CARE_PROVIDER_SITE_OTHER): Admitting: Internal Medicine

## 2023-10-20 VITALS — BP 122/80 | HR 99 | Temp 97.9°F | Ht 61.75 in | Wt 117.0 lb

## 2023-10-20 DIAGNOSIS — F1120 Opioid dependence, uncomplicated: Secondary | ICD-10-CM

## 2023-10-20 DIAGNOSIS — G8929 Other chronic pain: Secondary | ICD-10-CM | POA: Diagnosis not present

## 2023-10-20 DIAGNOSIS — R1013 Epigastric pain: Secondary | ICD-10-CM

## 2023-10-20 DIAGNOSIS — M545 Low back pain, unspecified: Secondary | ICD-10-CM | POA: Diagnosis not present

## 2023-10-20 NOTE — Assessment & Plan Note (Signed)
 PDMP reviewed.

## 2023-10-20 NOTE — Assessment & Plan Note (Signed)
 Maintains function with the 2-3 hydrocodone  daily

## 2023-10-20 NOTE — Progress Notes (Signed)
 Subjective:    Patient ID: Catherine Griffin, female    DOB: 12-28-1955, 68 y.o.   MRN: [id number]  HPI Here for follow up of chronic pain and narcotic dependence  Stomach did finally get better Took a month or so of the omeprazole  before it was better Tried aloe vera juice Now back to eating normal diet Taking the omeprazole  as needed----stomach acts up at times  Back pain is about the same 1 large pool contract still--but giving it up after this year  Current Outpatient Medications on File Prior to Visit  Medication Sig Dispense Refill   albuterol  (VENTOLIN  HFA) 108 (90 Base) MCG/ACT inhaler Inhale 2 puffs into the lungs every 6 (six) hours as needed for wheezing or shortness of breath. 8 g 2   aspirin 81 MG tablet Take 81 mg by mouth daily.     cholecalciferol (VITAMIN D) 1000 UNITS tablet Take 1,000 Units by mouth daily.     cyclobenzaprine  (FLEXERIL ) 10 MG tablet TAKE 1/2-1 TABLET (5-10 MG TOTAL) BY MOUTH AT BEDTIME AS NEEDED. 30 tablet 1   HYDROcodone -acetaminophen  (NORCO) 10-325 MG tablet Take 1 tablet by mouth 3 (three) times daily as needed. 75 tablet 0   levothyroxine  (SYNTHROID ) 50 MCG tablet TAKE 1 TABLET BY MOUTH EVERY DAY BEFORE BREAKFAST 90 tablet 3   omeprazole  (PRILOSEC) 20 MG capsule Take 20 mg by mouth daily as needed.     No current facility-administered medications on file prior to visit.    Allergies  Allergen Reactions   Codeine Phosphate [Codeine] Nausea Only   Indomethacin     REACTION: Rash/hives/swelling    Past Medical History:  Diagnosis Date   Abdominal pain    Abdominal pain, right upper quadrant    Actinic keratosis    04/25/07   Aphthous ulcer of mouth    Arthritis    Back pain    04/25/07   Chest pain    04/25/07   Diverticulosis of colon    02/28/09   Dry age-related macular degeneration    Fatigue    05/30/08   Hemorrhoid    12/01/10   Hepatitis C    Hyperlipidemia    Hypothyroidism    Hypothyroidism    Lichen sclerosus     Osteopenia    Osteoporosis    04/25/07   RUQ abdominal pain    02/07/09   Spinal stenosis    Thyroid  disease    hypothyrodism   Vaginitis    10/01/10   Wears dentures    partial upper and lower    Past Surgical History:  Procedure Laterality Date   ABDOMINAL HYSTERECTOMY     COLONOSCOPY WITH PROPOFOL  N/A 06/21/2017   Procedure: COLONOSCOPY WITH PROPOFOL ;  Surgeon: Janalyn Keene NOVAK, MD;  Location: [facility];  Service: Endoscopy;  Laterality: N/A;   ESOPHAGOGASTRODUODENOSCOPY (EGD) WITH PROPOFOL  N/A 06/21/2017   Procedure: ESOPHAGOGASTRODUODENOSCOPY (EGD) WITH PROPOFOL ;  Surgeon: Janalyn Keene NOVAK, MD;  Location: [facility];  Service: Endoscopy;  Laterality: N/A;   TONSILLECTOMY      Family History  Problem Relation Age of Onset   Heart disease Father    Arthritis Father    Other Father        joint replacement   Cancer Mother        lung   Anemia Sister    Other Sister        CHF   Breast cancer Sister 42   Cancer Sister  1 sister died of metastatic cancer   Congestive Heart Failure Sister    Cancer Brother        lung cancer   Coronary artery disease Neg Hx    Diabetes Neg Hx     Social History   Socioeconomic History   Marital status: Married    Spouse name: Not on file   Number of children: Not on file   Years of education: Not on file   Highest education level: 12th grade  Occupational History   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  Tobacco Use   Smoking status: Every Day    Current packs/day: 0.00    Types: Cigarettes    Last attempt to quit: 05/13/2021    Years since quitting: 2.4    Passive exposure: Past   Smokeless tobacco: Never   Tobacco comments:    since age 57  Vaping Use   Vaping status: Never Used  Substance and Sexual Activity   Alcohol use: No    Alcohol/week: 0.0 standard drinks of alcohol   Drug use: No   Sexual activity: Not on file  Other Topics Concern   Not on file   Social History Narrative   2 step children      Has living will   Health care POA is husband---sister [name] would be alternate   Would accept resuscitation   No prolonged tube feeds if cognitively unaware   Social Drivers of Health   Financial Resource Strain: Low Risk  (10/14/2023)   Overall Financial Resource Strain (CARDIA)    Difficulty of Paying Living Expenses: Not hard at all  Food Insecurity: No Food Insecurity (10/14/2023)   Hunger Vital Sign    Worried About Running Out of Food in the Last Year: Never true    Ran Out of Food in the Last Year: Never true  Transportation Needs: No Transportation Needs (10/14/2023)   PRAPARE - Administrator, Civil Service (Medical): No    Lack of Transportation (Non-Medical): No  Physical Activity: Insufficiently Active (10/14/2023)   Exercise Vital Sign    Days of Exercise per Week: 3 days    Minutes of Exercise per Session: 30 min  Stress: No Stress Concern Present (10/14/2023)   [employer] of Occupational Health - Occupational Stress Questionnaire    Feeling of Stress: Not at all  Social Connections: Moderately Isolated (10/14/2023)   Social Connection and Isolation Panel    Frequency of Communication with Friends and Family: Three times a week    Frequency of Social Gatherings with Friends and Family: Once a week    Attends Religious Services: Never    Database administrator or Organizations: No    Attends Engineer, structural: Not on file    Marital Status: Married  Catering manager Violence: Not At Risk (07/19/2023)   Humiliation, Afraid, Rape, and Kick questionnaire    Fear of Current or Ex-Partner: No    Emotionally Abused: No    Physically Abused: No    Sexually Abused: No   Review of Systems Sleep is variable--some trouble initiating at times, and interrupted Still smoking---would love to stop but tough      Objective:   Physical Exam Constitutional:      Appearance: She is well-developed.   Abdominal:     Palpations: Abdomen is soft.     Tenderness: There is no abdominal tenderness. There is no guarding or rebound.  Neurological:  Mental Status: She is alert.            Assessment & Plan:

## 2023-10-20 NOTE — Addendum Note (Signed)
 Addended by: ISADORA RAISIN on: 10/20/2023 02:19 PM   Modules accepted: Orders

## 2023-10-20 NOTE — Assessment & Plan Note (Signed)
 Mostly better with omeprazole  Now seems to notice symptoms when stomach is empty No tenderness today Will check H pylori stool test to be sure we don't need eradication protocol

## 2023-10-22 LAB — DRUG MONITORING, PANEL 8 WITH CONFIRMATION, URINE
6 Acetylmorphine: NEGATIVE ng/mL (ref <10)
Alcohol Metabolites: NEGATIVE ng/mL (ref <500)
Amphetamines: NEGATIVE ng/mL (ref <500)
Benzodiazepines: NEGATIVE ng/mL (ref <100)
Buprenorphine, Urine: NEGATIVE ng/mL (ref <5)
Cocaine Metabolite: NEGATIVE ng/mL (ref <150)
Codeine: NEGATIVE ng/mL (ref <50)
Creatinine: 77 mg/dL (ref >=20.0)
Hydrocodone: 1532 ng/mL — ABNORMAL HIGH (ref <50)
Hydromorphone: 1191 ng/mL — ABNORMAL HIGH (ref <50)
MDMA: NEGATIVE ng/mL (ref <500)
Marijuana Metabolite: NEGATIVE ng/mL (ref <20)
Morphine: NEGATIVE ng/mL (ref <50)
Norhydrocodone: 2260 ng/mL — ABNORMAL HIGH (ref <50)
Opiates: POSITIVE ng/mL — AB (ref <100)
Oxidant: NEGATIVE ug/mL (ref <200)
Oxycodone: NEGATIVE ng/mL (ref <100)
pH: 5.4 (ref 4.5–9.0)

## 2023-10-22 LAB — DM TEMPLATE

## 2023-10-23 ENCOUNTER — Ambulatory Visit: Payer: Self-pay | Admitting: Internal Medicine

## 2023-10-24 NOTE — Addendum Note (Signed)
 Addended by: KALLIE CLOTILDA SQUIBB on: 10/24/2023 09:00 AM   Modules accepted: Orders

## 2023-10-24 NOTE — Telephone Encounter (Signed)
 Name of Medication: Hydrocodone  10-325 Name of Pharmacy: [facility] Last Fill or Written Date and Quantity: 09-30-23 #75 Last Office Visit and Type: 10-20-23 Next Office Visit and Type: No Future OV (Planning to be a Dr Bennett pt) Last Controlled Substance Agreement Date: 10-20-23 Last UDS: 10-20-23

## 2023-10-25 ENCOUNTER — Other Ambulatory Visit: Payer: Self-pay

## 2023-10-25 ENCOUNTER — Encounter: Payer: Self-pay | Admitting: Internal Medicine

## 2023-10-25 DIAGNOSIS — R1013 Epigastric pain: Secondary | ICD-10-CM

## 2023-10-25 MED ORDER — HYDROCODONE-ACETAMINOPHEN 10-325 MG PO TABS: 1.0000 | ORAL_TABLET | Freq: Three times a day (TID) | ORAL | 0 refills | Status: DC | PRN

## 2023-10-26 LAB — HELICOBACTER PYLORI  SPECIAL ANTIGEN
MICRO NUMBER:: 16700952
SPECIMEN QUALITY: ADEQUATE

## 2023-11-15 ENCOUNTER — Encounter: Payer: Self-pay | Admitting: Internal Medicine

## 2023-11-15 DIAGNOSIS — F1120 Opioid dependence, uncomplicated: Secondary | ICD-10-CM

## 2023-11-15 DIAGNOSIS — G8929 Other chronic pain: Secondary | ICD-10-CM

## 2023-11-15 NOTE — Telephone Encounter (Signed)
 Name of Medication: Hydrocodone  10-325 Name of Pharmacy: [facility] Last Fill or Written Date and Quantity: 10-25-23 #75 Last Office Visit and Type: 10-20-23 Next Office Visit and Type: 01-23-24 Last Controlled Substance Agreement Date: 10-20-23 Last UDS: 10-20-23

## 2023-11-16 MED ORDER — HYDROCODONE-ACETAMINOPHEN 10-325 MG PO TABS: 1.0000 | ORAL_TABLET | Freq: Three times a day (TID) | ORAL | 0 refills | Status: DC | PRN

## 2023-12-02 ENCOUNTER — Encounter: Payer: Self-pay | Admitting: Internal Medicine

## 2023-12-02 MED ORDER — FLUCONAZOLE 150 MG PO TABS: 150.0000 mg | ORAL_TABLET | ORAL | 1 refills | Status: AC

## 2023-12-11 DIAGNOSIS — F1120 Opioid dependence, uncomplicated: Secondary | ICD-10-CM

## 2023-12-11 DIAGNOSIS — G8929 Other chronic pain: Secondary | ICD-10-CM

## 2023-12-13 MED ORDER — HYDROCODONE-ACETAMINOPHEN 10-325 MG PO TABS: 1.0000 | ORAL_TABLET | Freq: Three times a day (TID) | ORAL | 0 refills | Status: DC | PRN

## 2023-12-13 NOTE — Telephone Encounter (Signed)
 Name of Medication: Hydrocodone  10-325 Name of Pharmacy: [facility] Last Fill or Written Date and Quantity: 11-16-23 #75 Last Office Visit and Type: 10-20-23 Next Office Visit and Type: 01-23-24 Last Controlled Substance Agreement Date: 10-20-23 Last UDS: 10-20-23

## 2023-12-22 ENCOUNTER — Other Ambulatory Visit: Payer: Self-pay | Admitting: *Deleted

## 2023-12-22 MED ORDER — CYCLOBENZAPRINE HCL 10 MG PO TABS: ORAL_TABLET | ORAL | 0 refills | Status: DC

## 2023-12-22 NOTE — Telephone Encounter (Signed)
 TOC appt with Dr. Bennett scheduled on 01/23/24  Last filled on 10/18/23 #30/ 1 refill  [facility] [address] [place]

## 2024-01-05 DIAGNOSIS — F1120 Opioid dependence, uncomplicated: Secondary | ICD-10-CM

## 2024-01-05 DIAGNOSIS — G8929 Other chronic pain: Secondary | ICD-10-CM

## 2024-01-05 NOTE — Telephone Encounter (Signed)
 Do you mind doing the rx so she can get it tomorrow?

## 2024-01-05 NOTE — Telephone Encounter (Signed)
 Name of Medication: Hydrocodone  10-325 Name of Pharmacy: [facility] Last Fill or Written Date and Quantity: 12-13-23 #75 Last Office Visit and Type: 10-20-23 Next Office Visit and Type: 01-23-24 Last Controlled Substance Agreement Date: 10-20-23 Last UDS: 10-20-23

## 2024-01-06 MED ORDER — HYDROCODONE-ACETAMINOPHEN 10-325 MG PO TABS: 1.0000 | ORAL_TABLET | Freq: Three times a day (TID) | ORAL | 0 refills | Status: DC | PRN

## 2024-01-23 ENCOUNTER — Encounter

## 2024-01-24 ENCOUNTER — Other Ambulatory Visit: Payer: Self-pay | Admitting: Family

## 2024-01-27 MED ORDER — CYCLOBENZAPRINE HCL 10 MG PO TABS: ORAL_TABLET | ORAL | 0 refills | Status: DC

## 2024-01-27 NOTE — Telephone Encounter (Signed)
 TOC with Dr. Bennett was called and canceled. Patient moved out a month. Ok to refill as pended.

## 2024-01-31 DIAGNOSIS — G8929 Other chronic pain: Secondary | ICD-10-CM

## 2024-01-31 DIAGNOSIS — F1120 Opioid dependence, uncomplicated: Secondary | ICD-10-CM

## 2024-01-31 MED ORDER — HYDROCODONE-ACETAMINOPHEN 10-325 MG PO TABS: 1.0000 | ORAL_TABLET | Freq: Three times a day (TID) | ORAL | 0 refills | Status: DC | PRN

## 2024-01-31 NOTE — Telephone Encounter (Signed)
 Name of Medication: Hydrocodone  10-325 Name of Pharmacy: [facility] or Written Date and Quantity: 01-06-24 #75 Last Office Visit and Type: 10-20-23 Next Office Visit and Type: 02-23-24 Last Controlled Substance Agreement Date: 10-20-23 Last UDS: 10-20-23

## 2024-02-23 ENCOUNTER — Ambulatory Visit (INDEPENDENT_AMBULATORY_CARE_PROVIDER_SITE_OTHER)

## 2024-02-23 VITALS — BP 102/72 | HR 78 | Temp 98.0°F | Ht 61.75 in | Wt 119.0 lb

## 2024-02-23 DIAGNOSIS — M549 Dorsalgia, unspecified: Secondary | ICD-10-CM | POA: Diagnosis not present

## 2024-02-23 DIAGNOSIS — E039 Hypothyroidism, unspecified: Secondary | ICD-10-CM | POA: Diagnosis not present

## 2024-02-23 DIAGNOSIS — F419 Anxiety disorder, unspecified: Secondary | ICD-10-CM | POA: Insufficient documentation

## 2024-02-23 DIAGNOSIS — Z131 Encounter for screening for diabetes mellitus: Secondary | ICD-10-CM

## 2024-02-23 DIAGNOSIS — G8929 Other chronic pain: Secondary | ICD-10-CM

## 2024-02-23 DIAGNOSIS — F17200 Nicotine dependence, unspecified, uncomplicated: Secondary | ICD-10-CM | POA: Diagnosis not present

## 2024-02-23 DIAGNOSIS — Z113 Encounter for screening for infections with a predominantly sexual mode of transmission: Secondary | ICD-10-CM

## 2024-02-23 DIAGNOSIS — E785 Hyperlipidemia, unspecified: Secondary | ICD-10-CM

## 2024-02-23 DIAGNOSIS — F1120 Opioid dependence, uncomplicated: Secondary | ICD-10-CM

## 2024-02-23 MED ORDER — VARENICLINE TARTRATE (STARTER) 0.5 MG X 11 & 1 MG X 42 PO TBPK: ORAL_TABLET | ORAL | 0 refills | Status: DC

## 2024-02-23 MED ORDER — NICOTINE POLACRILEX 4 MG MT GUM: 4.0000 mg | CHEWING_GUM | OROMUCOSAL | 0 refills | Status: AC | PRN

## 2024-02-23 MED ORDER — NICOTINE 21 MG/24HR TD PT24: 21.0000 mg | MEDICATED_PATCH | Freq: Every day | TRANSDERMAL | 0 refills | Status: AC

## 2024-02-23 MED ORDER — HYDROCODONE-ACETAMINOPHEN 10-325 MG PO TABS: 1.0000 | ORAL_TABLET | Freq: Three times a day (TID) | ORAL | 0 refills | Status: DC | PRN

## 2024-02-23 NOTE — Patient Instructions (Addendum)
 Thank you for visiting [facility] today! Here's what we talked about: - START Chantix, Nicotine patch and gum daily - Come back for labs and xray - Try to use only 2 and a half pills of the Norco, can do Tylenol  in between

## 2024-02-23 NOTE — Progress Notes (Signed)
 Subjective:   This visit was conducted in person. The patient gave informed consent to the use of Abridge AI technology to record the contents of the encounter as documented below.   Patient ID: Catherine Griffin, female    DOB: 1955/04/18, 68 y.o.   MRN: [id number]   Discussed the use of AI scribe software for clinical note transcription with the patient, who gave verbal consent to proceed.  History of Present Illness Catherine Griffin is a 68 year old female with COPD, osteoarthritis, and chronic back pain who presents for management of chronic pain and smoking cessation.  She experiences chronic pain primarily in her back, described as a sensation of 'something missing in the very bottom of your spine.' Pain is also present in her right hip and left knee, with the hip pain occasionally radiating down the front of her leg. 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 She has not had recent imaging or physical therapy for her back or hip pain, with the last x-ray of her hip in 2016 and a back scan in 2018 showing misalignment of the discs.  She has a history of COPD and continues to smoke a pack of cigarettes daily. She wants to quit smoking and has previously quit for almost a year. She is open to using nicotine replacement therapy and medication to aid in smoking cessation.  She experiences anxiety and stress, which she attributes to her health and personal circumstances, including caring for her husband who has memory issues following carotid surgery. She reports waking up at night feeling hot and sweaty, with an elevated heart rate, which resolves when she moves to a cooler room.  Her past medical history includes hypothyroidism, for which she takes Synthroid  at 3 AM on an empty stomach, and low bone density. She also takes Prilosec 20  mg as needed for reflux, vitamin D daily, and baby aspirin, which she has been on for over ten years. She occasionally uses ibuprofen for pain but notes it can upset her stomach.  She reports a family history of cancer, with all her family members who smoked having died from the disease.   Review of Systems  All other systems reviewed and are negative.       Allergies  Allergen Reactions   Codeine Phosphate [Codeine] Nausea Only   Indomethacin     REACTION: Rash/hives/swelling    Current Outpatient Medications on File Prior to Visit  Medication Sig Dispense Refill   albuterol  (VENTOLIN  HFA) 108 (90 Base) MCG/ACT inhaler Inhale 2 puffs into the lungs every 6 (six) hours as needed for wheezing or shortness of breath. 8 g 2   aspirin 81 MG tablet Take 81 mg by mouth daily.     cholecalciferol (VITAMIN D) 1000 UNITS tablet Take 1,000 Units by mouth daily.     cyclobenzaprine  (FLEXERIL ) 10 MG tablet TAKE 1/2-1 TABLET (5-10 MG TOTAL) BY MOUTH AT BEDTIME AS NEEDED. 30 tablet 0   levothyroxine  (SYNTHROID ) 50 MCG tablet TAKE 1 TABLET BY MOUTH EVERY DAY BEFORE BREAKFAST 90 tablet 3   omeprazole  (PRILOSEC) 20 MG capsule Take 20 mg by mouth daily as needed.     No current facility-administered medications on file prior to visit.    BP 102/72 (BP Location: Left Arm, Patient Position: Sitting, Cuff Size: Normal)   Pulse  78   Temp 98 F (36.7 C) (Oral)   Ht 5' 1.75 (1.568 m)   Wt 119 lb (54 kg)   SpO2 94%   BMI 21.94 kg/m   Objective:      Physical Exam GENERAL: Alert, cooperative, well developed, no acute distress. HEAD: Normocephalic atraumatic. EYES: Extraocular movements intact BL, pupils round, equal and reactive to light BL, conjunctivae normal BL. EXTREMITIES: No cyanosis or edema. NEUROLOGICAL: Oriented to person, place and time, no gait abnormalities, moves all extremities without gross motor or sensory deficit.       Assessment & Plan:   Assessment & Plan Nicotine  dependence (active smoking) Active smoker, currently smoking a pack a day, desires to quit. Discussed risks and cessation strategies including nicotine replacement and Chantix.  Counseled about side effects, agreeable to start.  - Prescribed Chantix with gradual dosage increase. - Prescribed nicotine patch and gum. 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 Concerns about opioid dependence and tolerance. Discussed physical therapy as a long-term solution, she declines at this time.  No recent imaging of the back, ordered below.  - Ordered x-ray of the back. - Signed controlled substance use agreement. - She would like to try Norco dosage to 2.5 tablets daily with Tylenol  between doses. - Re-discussed potential for physical therapy.  Hypothyroidism Managed with Synthroid . Thyroid  function test needed. - Ordered thyroid  function test. - Continue Synthroid  50 mcg daily   General Health maintenance Due for physical examination and lab work to assess cholesterol and diabetes. - Scheduled physical examination in four weeks. - Ordered fasting labs for cholesterol, diabetes, HIV, and hepatitis C.    Return in about 4 weeks (around 03/22/2024) for CPE, fasting labs and xray appointment in 1 week.   Daryn Pisani K Tonette Koehne, MD  02/23/24     Contains text generated by Abridge.

## 2024-02-26 ENCOUNTER — Other Ambulatory Visit: Payer: Self-pay | Admitting: Family

## 2024-03-02 MED ORDER — CYCLOBENZAPRINE HCL 10 MG PO TABS: ORAL_TABLET | ORAL | 0 refills | Status: DC

## 2024-03-21 DIAGNOSIS — F1120 Opioid dependence, uncomplicated: Secondary | ICD-10-CM

## 2024-03-21 DIAGNOSIS — G8929 Other chronic pain: Secondary | ICD-10-CM

## 2024-03-23 MED ORDER — HYDROCODONE-ACETAMINOPHEN 10-325 MG PO TABS: 1.0000 | ORAL_TABLET | Freq: Three times a day (TID) | ORAL | 0 refills | Status: AC | PRN

## 2024-03-23 NOTE — Telephone Encounter (Signed)
 Name of Medication: Hydrocodone  10-325 Name of [facility]acy: [facility] or Written Date and Quantity: 02-23-24 #75 Last Office Visit and Type: 02-23-24 Next Office Visit and Type: 04-24-24 Last Controlled Substance Agreement Date: 10-20-23 Last UDS: 10-20-23

## 2024-04-01 ENCOUNTER — Other Ambulatory Visit: Payer: Self-pay

## 2024-04-17 ENCOUNTER — Other Ambulatory Visit (INDEPENDENT_AMBULATORY_CARE_PROVIDER_SITE_OTHER)

## 2024-04-17 ENCOUNTER — Ambulatory Visit: Admission: RE | Admit: 2024-04-17 | Discharge: 2024-04-17 | Disposition: A | Source: Ambulatory Visit

## 2024-04-17 DIAGNOSIS — E785 Hyperlipidemia, unspecified: Secondary | ICD-10-CM

## 2024-04-17 DIAGNOSIS — Z131 Encounter for screening for diabetes mellitus: Secondary | ICD-10-CM

## 2024-04-17 DIAGNOSIS — G8929 Other chronic pain: Secondary | ICD-10-CM | POA: Diagnosis not present

## 2024-04-17 DIAGNOSIS — M549 Dorsalgia, unspecified: Secondary | ICD-10-CM | POA: Diagnosis not present

## 2024-04-17 DIAGNOSIS — E039 Hypothyroidism, unspecified: Secondary | ICD-10-CM | POA: Diagnosis not present

## 2024-04-17 DIAGNOSIS — Z113 Encounter for screening for infections with a predominantly sexual mode of transmission: Secondary | ICD-10-CM

## 2024-04-17 LAB — HEMOGLOBIN A1C: Hgb A1c MFr Bld: 7 % — ABNORMAL HIGH (ref 4.6–6.5)

## 2024-04-17 LAB — TSH: TSH: 1.95 u[IU]/mL (ref 0.35–5.50)

## 2024-04-17 LAB — LIPID PANEL
Cholesterol: 212 mg/dL — ABNORMAL HIGH (ref 28–200)
HDL: 48.5 mg/dL
LDL Cholesterol: 138 mg/dL — ABNORMAL HIGH (ref 10–99)
NonHDL: 163.07
Total CHOL/HDL Ratio: 4
Triglycerides: 127 mg/dL (ref 10.0–149.0)
VLDL: 25.4 mg/dL (ref 0.0–40.0)

## 2024-04-20 LAB — HEPATITIS C RNA QUANTITATIVE
HCV Quantitative Log: <1.18 {Log_IU}/mL
HCV RNA, PCR, QN: <15 [IU]/mL

## 2024-04-20 LAB — HEPATITIS C ANTIBODY: Hepatitis C Ab: REACTIVE — AB

## 2024-04-24 ENCOUNTER — Encounter

## 2024-04-24 ENCOUNTER — Ambulatory Visit: Payer: Self-pay

## 2024-04-24 DIAGNOSIS — E119 Type 2 diabetes mellitus without complications: Secondary | ICD-10-CM | POA: Insufficient documentation

## 2024-04-24 MED ORDER — HYDROCODONE-ACETAMINOPHEN 10-325 MG PO TABS: 1.0000 | ORAL_TABLET | Freq: Three times a day (TID) | ORAL | 0 refills | Status: DC | PRN

## 2024-04-24 NOTE — Telephone Encounter (Signed)
 Name of Medication: Hydrocodone  10-325 Name of Pharmacy: [facility] or Written Date and Quantity: 03-23-24 #75 Last Office Visit and Type: 02-23-24 Next Office Visit and Type: 04-24-24 (Cancelled due to Dr Bennett being out) Last Controlled Substance Agreement Date: 10-20-23 Last UDS: 10-20-23

## 2024-04-30 ENCOUNTER — Ambulatory Visit

## 2024-04-30 VITALS — BP 110/66 | HR 72 | Temp 97.3°F | Ht 61.75 in | Wt 115.0 lb

## 2024-04-30 DIAGNOSIS — E119 Type 2 diabetes mellitus without complications: Secondary | ICD-10-CM | POA: Diagnosis not present

## 2024-04-30 DIAGNOSIS — F17200 Nicotine dependence, unspecified, uncomplicated: Secondary | ICD-10-CM

## 2024-04-30 DIAGNOSIS — E7849 Other hyperlipidemia: Secondary | ICD-10-CM | POA: Diagnosis not present

## 2024-04-30 LAB — MICROALBUMIN / CREATININE URINE RATIO
Creatinine,U: 45.4 mg/dL
Microalb Creat Ratio: UNDETERMINED mg/g (ref 0.0–30.0)
Microalb, Ur: <0.7 mg/dL

## 2024-04-30 MED ORDER — ATORVASTATIN CALCIUM 20 MG PO TABS: 20.0000 mg | ORAL_TABLET | Freq: Every day | ORAL | 3 refills | Status: AC

## 2024-04-30 MED ORDER — LISINOPRIL 2.5 MG PO TABS: 2.5000 mg | ORAL_TABLET | Freq: Every day | ORAL | 3 refills | Status: DC

## 2024-04-30 NOTE — Patient Instructions (Signed)
 Thank you for visiting [facility] today! Here's what we talked about: - Ask eye doctor for diabetic eye exam and fax to your PCP - START Lisinopril  and Lipitor

## 2024-04-30 NOTE — Progress Notes (Signed)
 "  Subjective:   This visit was conducted in person. The patient gave informed consent to the use of [employer] AI technology to record the contents of the encounter as documented below.   Patient ID: Catherine Griffin, female    DOB: Sep 09, 1955, 69 y.o.   MRN: [id number]   Catherine Griffin is a very pleasant 69 y.o. female who presents today for DM visit.  Discussed the use of AI scribe software for clinical note transcription with the patient, who gave verbal consent to proceed.  History of Present Illness Catherine Griffin is a 69 year old female who presents for smoking cessation and diabetes management.  She is actively trying to quit smoking and has been using nicotine  replacement therapies since November. She initially started with Chantix  but discontinued it due to experiencing vivid dreams. She continues to smoke about ten cigarettes a day. Nicotine  patches are used daily, but they do not adhere well, especially when sweating, and she avoids wearing them at night due to dreams. Nicotine  gum is used sparingly due to recent dental work.  She was recently diagnosed with diabetes, with an A1c of 7.0%. Recent dietary changes due to home remodeling have led to increased consumption of takeout and processed foods. She drinks zero sugar soda and occasionally consumes fruit juice.  She has a history of high cholesterol, with a risk calculation of 10.4% for cardiovascular events. She is familiar with cholesterol medications as her husband takes them. No numbness, tingling in the feet, or vision changes are reported.  She received a pneumonia vaccination in 2022 and does not wish to receive the flu vaccine this season. She plans to have her eyes checked in February and is aware of the importance of foot care due to diabetes.  She has been remodeling her house, which has affected her diet and cooking habits. She engages in physical activity during the summer through her job but is less  active in the winter.     -Denies polydipsia, polyuria, weight loss, fatigue or any LE wounds.    Review of Systems  All other systems reviewed and are negative.        Past Medical History:  Diagnosis Date   Abdominal pain    Abdominal pain, right upper quadrant    Actinic keratosis    04/25/07   Aphthous ulcer of mouth    Arthritis    Back pain    04/25/07   Chest pain    04/25/07   Diverticulosis of colon    02/28/09   Dry age-related macular degeneration    Fatigue    05/30/08   Hemorrhoid    12/01/10   Hepatitis C    Hyperlipidemia    Hypothyroidism    Hypothyroidism    Lichen sclerosus    Osteopenia    Osteoporosis    04/25/07   RUQ abdominal pain    02/07/09   Spinal stenosis    Thyroid  disease    hypothyrodism   Vaginitis    10/01/10   Wears dentures    partial upper and lower    Social History   Socioeconomic History   Marital status: Married    Spouse name: Not on file   Number of children: Not on file   Years of education: Not on file   Highest education level: 12th grade  Occupational History   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  Tobacco Use   Smoking status: Every  Day    Current packs/day: 0.00    Average packs/day: 1.0 packs/day    Types: Cigarettes    Last attempt to quit: 05/13/2021    Years since quitting: 2.9    Passive exposure: Past   Smokeless tobacco: Never   Tobacco comments:    since age 72  Vaping Use   Vaping status: Never Used  Substance and Sexual Activity   Alcohol use: No    Alcohol/week: 0.0 standard drinks of alcohol   Drug use: No   Sexual activity: Not on file  Other Topics Concern   Not on file  Social History Narrative   2 step children      Has living will   Health care POA is husband---sister [name] would be alternate   Would accept resuscitation   No prolonged tube feeds if cognitively unaware   Social Drivers of Health   Tobacco Use: High Risk (02/23/2024)    Patient History    Smoking Tobacco Use: Every Day    Smokeless Tobacco Use: Never    Passive Exposure: Past  Financial Resource Strain: Low Risk (02/23/2024)   Overall Financial Resource Strain (CARDIA)    Difficulty of Paying Living Expenses: Not hard at all  Food Insecurity: No Food Insecurity (02/23/2024)   Epic    Worried About Programme Researcher, Broadcasting/film/video in the Last Year: Never true    Ran Out of Food in the Last Year: Never true  Transportation Needs: No Transportation Needs (02/23/2024)   Epic    Lack of Transportation (Medical): No    Lack of Transportation (Non-Medical): No  Physical Activity: Sufficiently Active (02/23/2024)   Exercise Vital Sign    Days of Exercise per Week: 3 days    Minutes of Exercise per Session: 60 min  Stress: Stress Concern Present (02/23/2024)   [employer] of Occupational Health - Occupational Stress Questionnaire    Feeling of Stress: To some extent  Social Connections: Moderately Isolated (02/23/2024)   Social Connection and Isolation Panel    Frequency of Communication with Friends and Family: More than three times a week    Frequency of Social Gatherings with Friends and Family: Twice a week    Attends Religious Services: Never    Database Administrator or Organizations: No    Attends Engineer, Structural: Not on file    Marital Status: Married  Catering Manager Violence: Not At Risk (07/19/2023)   Humiliation, Afraid, Rape, and Kick questionnaire    Fear of Current or Ex-Partner: No    Emotionally Abused: No    Physically Abused: No    Sexually Abused: No  Depression (PHQ2-9): Low Risk (02/23/2024)   Depression (PHQ2-9)    PHQ-2 Score: 1  Alcohol Screen: Low Risk (07/19/2023)   Alcohol Screen    Last Alcohol Screening Score (AUDIT): 0  Housing: Low Risk (02/23/2024)   Epic    Unable to Pay for Housing in the Last Year: No    Number of Times Moved in the Last Year: 0    Homeless in the Last Year: No  Utilities: Not At Risk  (07/19/2023)   [facility] Utilities    Threatened with loss of utilities: No  Health Literacy: Not on file    Past Surgical History:  Procedure Laterality Date   ABDOMINAL HYSTERECTOMY     COLONOSCOPY WITH PROPOFOL  N/A 06/21/2017   Procedure: COLONOSCOPY WITH PROPOFOL ;  Surgeon: Janalyn Keene NOVAK, MD;  Location: [facility];  Service: Endoscopy;  Laterality:  N/A;   ESOPHAGOGASTRODUODENOSCOPY (EGD) WITH PROPOFOL  N/A 06/21/2017   Procedure: ESOPHAGOGASTRODUODENOSCOPY (EGD) WITH PROPOFOL ;  Surgeon: Janalyn Keene NOVAK, MD;  Location: [facility];  Service: Endoscopy;  Laterality: N/A;   TONSILLECTOMY      Family History  Problem Relation Age of Onset   Heart disease Father    Arthritis Father    Other Father        joint replacement   Cancer Mother        lung   Anemia Sister    Other Sister        CHF   Breast cancer Sister 17   Cancer Sister        1 sister died of metastatic cancer   Congestive Heart Failure Sister    Cancer Brother        lung cancer   Coronary artery disease Neg Hx    Diabetes Neg Hx     Allergies[1]  Medications Ordered Prior to Encounter[2]  BP 110/66 (BP Location: Left Arm, Patient Position: Sitting, Cuff Size: Normal)   Pulse 72   Temp (!) 97.3 F (36.3 C) (Oral)   Ht 5' 1.75 (1.568 m)   Wt 115 lb (52.2 kg)   BMI 21.20 kg/m   Objective:      Physical Exam Vitals and nursing note reviewed.  Constitutional:      Appearance: Normal appearance.  Cardiovascular:     Pulses:          Dorsalis pedis pulses are 2+ on the right side and 2+ on the left side.       Posterior tibial pulses are 2+ on the right side and 2+ on the left side.  Feet:     Right foot:     Protective Sensation: 10 sites tested.  10 sites sensed.     Skin integrity: Skin integrity normal.     Toenail Condition: Right toenails are normal.     Left foot:     Protective Sensation: 10 sites tested.  10 sites sensed.     Skin integrity: Skin integrity  normal.     Toenail Condition: Left toenails are normal.  Neurological:     Mental Status: She is alert.          Assessment & Plan:   Assessment & Plan Type 2 diabetes mellitus New diagnosis with A1c of 7.0%. Goal A1c <7.5% due to age. No medication needed currently. Discussed dietary changes and exercise. Emphasized diabetic eye exam and kidney protection. - Recheck A1c in 3 months. - Encouraged dietary changes to reduce processed foods and sugary drinks. - Encouraged regular exercise. - Instructed to ensure diabetic eye exam in February. - Ordered urine sample for proteinuria. - Started lisinopril  2.5 mg daily for kidney protection. - Ordered kidney function tests in 2 weeks after starting lisinopril .  Current A1c: 7 Med regimen: None Urine microalbumin: Ordered Eye exam: Due in Feb  Foot exam: Done Influenza and Pneumococcal vaccines: Declines flu, had PCV 23 in 2022, holding off on giving Prevnar 20 for now Neuropathy: No Nephropathy: No Retinopathy: No  Diet: Needs to minimize processed foods Weight concerns: No   Nicotine  dependence (active smoking) Continues smoking 10 cigarettes daily. Discontinued Chantix  due to vivid dreams. Nicotine  patches not adhering well but still using. Occasional nicotine  gum use. No interest in Wellbutrin. - Continue nicotine  patches and gum. - Encouraged reduction of smoking by one cigarette per week. - Advised to try different locations for nicotine  patches.  Hyperlipidemia High cholesterol with cardiovascular risk of 10.4%. Discussed diabetes and high cholesterol synergy on cardiovascular risk. Recommended atorvastatin  to lower cholesterol and reduce cardiovascular risk. Discussed potential side effects and extended-release option. - Started atorvastatin  20 mg daily. - Will repeat cholesterol levels in 6 months or 1 year.    Return in about 2 months (around 07/12/2024) for CPE (already scheduled), pls schedule labs in  2wks.  Josseline Reddin K Messiyah Waterson, MD  04/30/24    Contains text generated by [employer].        [1]  Allergies Allergen Reactions   Codeine Phosphate [Codeine] Nausea Only   Indomethacin     REACTION: Rash/hives/swelling  [2]  Current Outpatient Medications on File Prior to Visit  Medication Sig Dispense Refill   albuterol  (VENTOLIN  HFA) 108 (90 Base) MCG/ACT inhaler Inhale 2 puffs into the lungs every 6 (six) hours as needed for wheezing or shortness of breath. 8 g 2   cholecalciferol (VITAMIN D) 1000 UNITS tablet Take 1,000 Units by mouth daily.     cyclobenzaprine  (FLEXERIL ) 10 MG tablet TAKE 1/2-1 TABLET (5-10 MG TOTAL) BY MOUTH AT BEDTIME AS NEEDED. 30 tablet 0   HYDROcodone -acetaminophen  (NORCO) 10-325 MG tablet Take 1 tablet by mouth 3 (three) times daily as needed. 75 tablet 0   levothyroxine  (SYNTHROID ) 50 MCG tablet TAKE 1 TABLET BY MOUTH EVERY DAY BEFORE BREAKFAST 90 tablet 3   nicotine  (NICODERM CQ  - DOSED IN MG/24 HOURS) 21 mg/24hr patch Place 1 patch (21 mg total) onto the skin daily. 90 patch 0   nicotine  polacrilex (NICORETTE ) 4 MG gum Take 1 each (4 mg total) by mouth as needed for up to 100 doses for smoking cessation. 100 tablet 0   No current facility-administered medications on file prior to visit.   "

## 2024-05-03 ENCOUNTER — Other Ambulatory Visit: Payer: Self-pay

## 2024-05-03 DIAGNOSIS — E119 Type 2 diabetes mellitus without complications: Secondary | ICD-10-CM

## 2024-05-06 ENCOUNTER — Ambulatory Visit: Payer: Self-pay

## 2024-05-11 ENCOUNTER — Telehealth: Payer: Self-pay

## 2024-05-11 MED ORDER — LOSARTAN POTASSIUM 25 MG PO TABS: 12.5000 mg | ORAL_TABLET | Freq: Every day | ORAL | 0 refills | Status: AC

## 2024-05-11 NOTE — Addendum Note (Signed)
 Addended by: BENNETT REUBEN POUR on: 05/11/2024 08:29 AM   Modules accepted: Orders

## 2024-05-11 NOTE — Telephone Encounter (Signed)
 Please call patient and reschedule lab visit from current date to 2 weeks from now

## 2024-05-14 ENCOUNTER — Other Ambulatory Visit

## 2024-05-15 NOTE — Telephone Encounter (Signed)
 Called and schedule pt for  labs

## 2024-05-16 MED ORDER — HYDROCODONE-ACETAMINOPHEN 10-325 MG PO TABS: 1.0000 | ORAL_TABLET | Freq: Three times a day (TID) | ORAL | 0 refills | Status: AC | PRN

## 2024-05-16 NOTE — Telephone Encounter (Signed)
 Name of Medication: Hydrocodone  10-325 Name of Pharmacy: [facility] or Written Date and Quantity: 03-23-24 #75 Last Office Visit and Type: 04-30-24 Next Office Visit and Type: No Future OV Last Controlled Substance Agreement Date: 10-20-23 Last UDS: 10-20-23

## 2024-05-29 ENCOUNTER — Other Ambulatory Visit

## 2024-07-12 ENCOUNTER — Encounter

## 2024-07-19 ENCOUNTER — Ambulatory Visit

## 2024-07-23 ENCOUNTER — Encounter

## 2024-07-25 ENCOUNTER — Ambulatory Visit
